# Patient Record
Sex: Female | Born: 2000
Health system: Southern US, Community
[De-identification: ages and names within clinical notes are randomized; demographics above are authoritative.]

## PROBLEM LIST (undated history)

## (undated) DIAGNOSIS — T7840XA Allergy, unspecified, initial encounter: Secondary | ICD-10-CM

## (undated) HISTORY — PX: NO PAST SURGERIES: SHX2092

## (undated) HISTORY — DX: Allergy, unspecified, initial encounter: T78.40XA

---
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
## 2020-01-16 IMAGING — CT CT HEAR MORPH WITH CTA COR WITH SCORE WITH CA WITH CONTRAST AND
2 of 10 series · 8 of 20 positions shown, 10 images · IV contrast (APPLIED)
Comparison: None.

Addendum:
CLINICAL DATA: Chest pain

EXAM:
Cardiac CTA
MEDICATIONS:
Sub lingual nitro. 4mg and lopressor 0mg
TECHNIQUE: The patient was scanned on a Siemens Force [REDACTED]ice scanner. Gantry
rotation speed was 250 msecs. Collimation was .6 mm. A 100 kV
retrospective scan was triggered in the ascending thoracic aorta at
140 HU's Average HR during the scan was 68 bpm. The 3D data set was
interpreted on a dedicated work station using MPR, MIP and VRT
modes. A total of 80 cc of contrast was used.

[Series 11: mulitphase · axial · 0.39mm/px · z∈[+579,+637]mm · 2 of 2628 slices shown]
[im 876/2628  vessel]
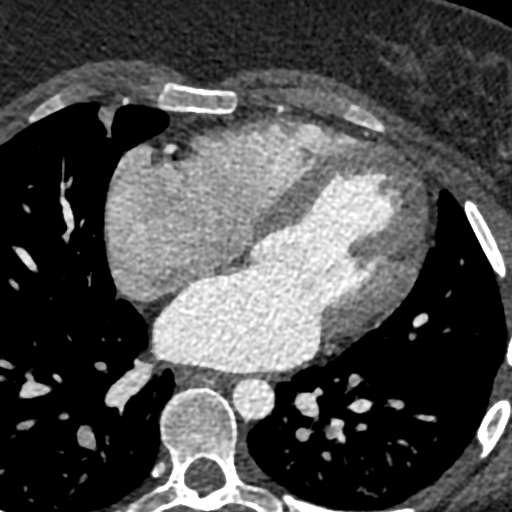
[im 1752/2628  vessel]
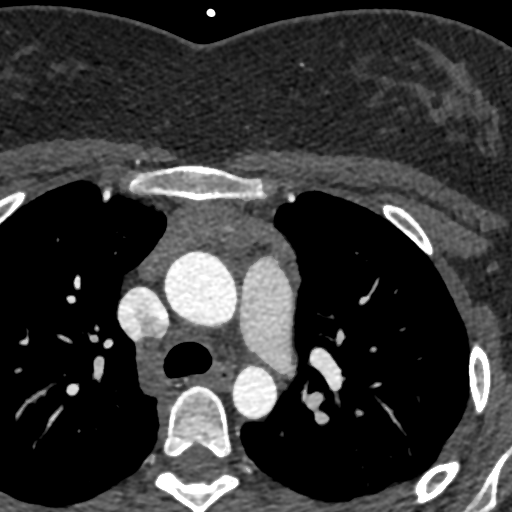

[Series 14: 0-90 · axial · 0.39mm/px · z∈[+545,+670]mm · 6 of 4380 slices shown, 8 images]
[im 626/4380  vessel]
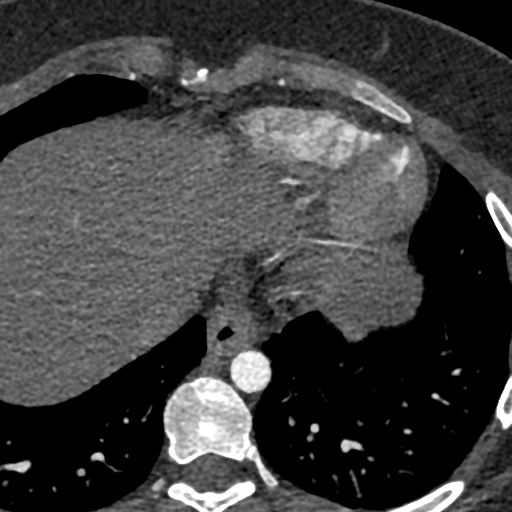
[im 626/4380  lung]
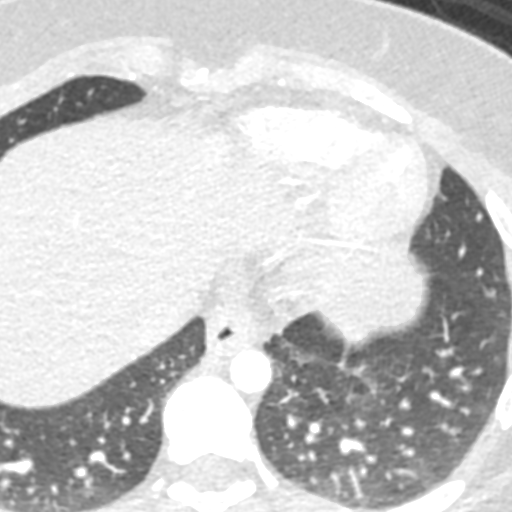
[im 1252/4380  vessel]
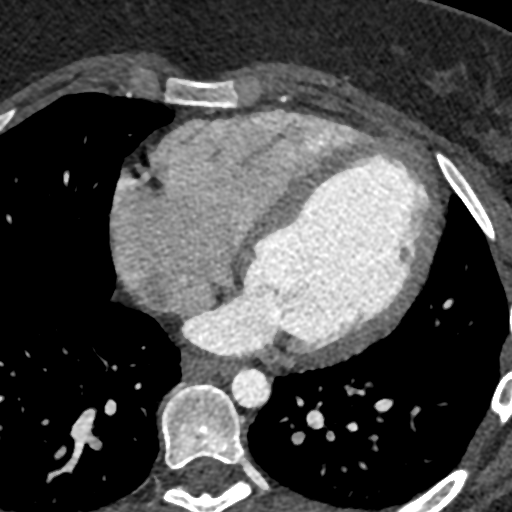
[im 1877/4380  vessel]
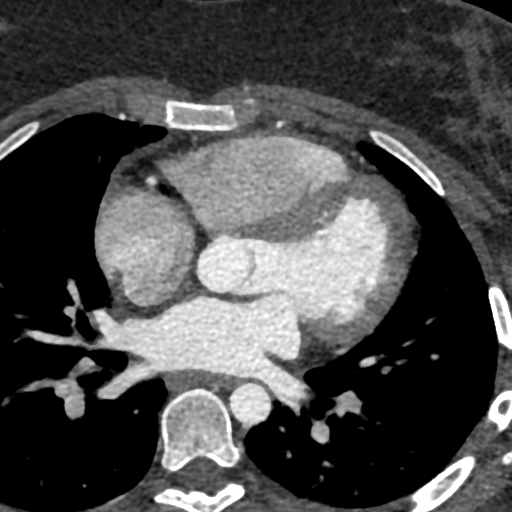
[im 2503/4380  vessel]
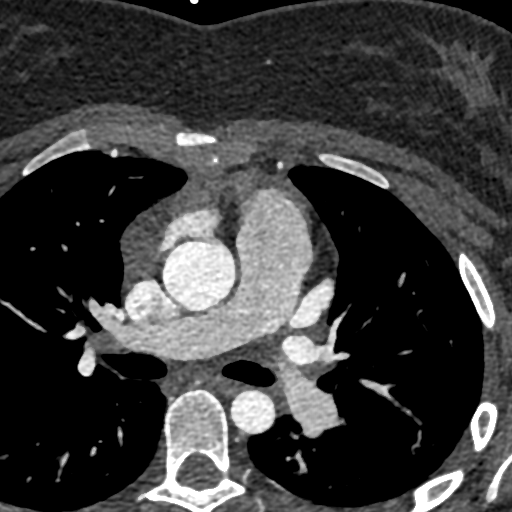
[im 3128/4380  vessel]
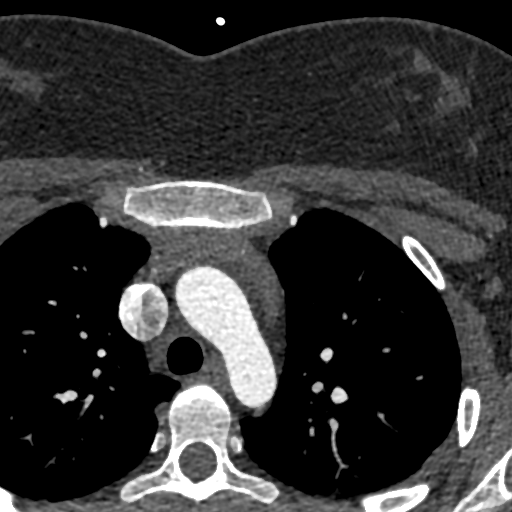
[im 3128/4380  lung]
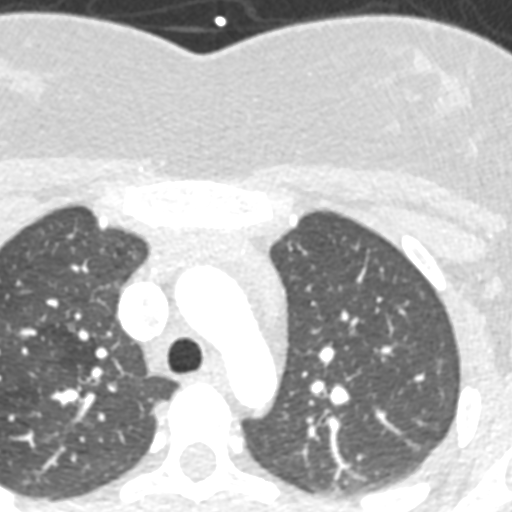
[im 3754/4380  vessel]
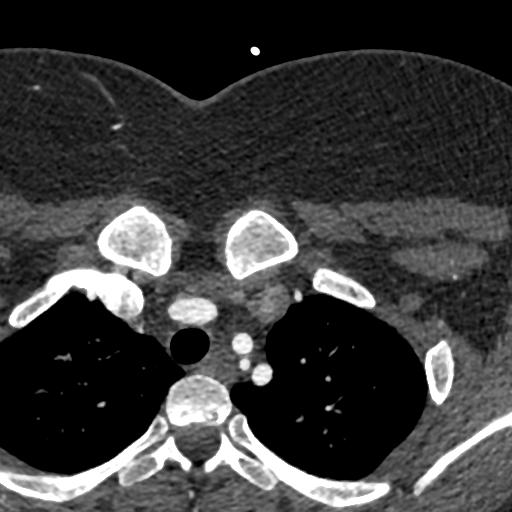

[8 of 20 positions shown; findings below may reference images not displayed]

FINDINGS: Non-cardiac: See separate report from [REDACTED]. No
significant findings on limited lung and soft tissue windows.

Calcium Score: No calcium noted

Aortic Valve Non calcified Appears functionally bicuspid with fusion
of the non and right cusps No stenosis with MAXWELL 3.2 cm 2 by
planimetry

Aorta: Normal ascending aortic root 2.6 cm. Normal arch vessels. No
coarctation

Coronary Arteries: Right dominant with no anomalies and no
myocardial bridging

LM: Normal

LAD: Normal

D1: Normal

D2: Normal

Circumflex: Normal

OM1: Normal

OM2: Normal

RCA: Normal

PDA: Normal

PLA: Normal
IMPRESSION: 1. Calcium Score 0

2. Normal aortic root and arch vessels with no coarctation Ascending
root 2.6 cm

3. Functionally bi cuspid AV with fused right and non cusps no AS,
MAXWELL by planimetry 3.2 cm2

4. Normal right dominant coronary arteries with no anomalies or
bridging

Witkacy Elsner

EXAM:
OVER-READ INTERPRETATION  CT CHEST

The following report is an over-read performed by radiologist Dr.
over-read does not include interpretation of cardiac or coronary
anatomy or pathology. The interpretation by the cardiologist is
attached.
FINDINGS: There may be distal esophageal wall thickening which can be seen
with gastroesophageal reflux. Heart is enlarged. No pathologically
enlarged lymph nodes. Visualized upper abdomen is unremarkable. No
worrisome pulmonary nodules. No pleural fluid. Osseous structures
are unremarkable.
IMPRESSION: No acute findings.

*** End of Addendum ***
FINDINGS: Non-cardiac: See separate report from [REDACTED]. No
significant findings on limited lung and soft tissue windows.

Calcium Score: No calcium noted

Aortic Valve Non calcified Appears functionally bicuspid with fusion
of the non and right cusps No stenosis with MAXWELL 3.2 cm 2 by
planimetry

Aorta: Normal ascending aortic root 2.6 cm. Normal arch vessels. No
coarctation

Coronary Arteries: Right dominant with no anomalies and no
myocardial bridging

LM: Normal

LAD: Normal

D1: Normal

D2: Normal

Circumflex: Normal

OM1: Normal

OM2: Normal

RCA: Normal

PDA: Normal

PLA: Normal
IMPRESSION: 1. Calcium Score 0

2. Normal aortic root and arch vessels with no coarctation Ascending
root 2.6 cm

3. Functionally bi cuspid AV with fused right and non cusps no AS,
MAXWELL by planimetry 3.2 cm2

4. Normal right dominant coronary arteries with no anomalies or
bridging

Witkacy Elsner

## 2020-02-02 ENCOUNTER — Encounter: Payer: Self-pay | Admitting: Pediatrics

## 2020-05-07 ENCOUNTER — Other Ambulatory Visit: Payer: Self-pay

## 2020-05-07 MED ORDER — FAMOTIDINE 20 MG PO TABS: 20.0000 mg | ORAL_TABLET | Freq: Every day | ORAL | 0 refills | Status: DC

## 2020-06-21 ENCOUNTER — Encounter: Payer: Self-pay | Admitting: Internal Medicine

## 2020-06-21 ENCOUNTER — Telehealth: Payer: Self-pay | Admitting: *Deleted

## 2020-06-21 ENCOUNTER — Telehealth (INDEPENDENT_AMBULATORY_CARE_PROVIDER_SITE_OTHER): Payer: BC Managed Care – PPO | Admitting: Internal Medicine

## 2020-06-21 ENCOUNTER — Telehealth: Payer: Self-pay | Admitting: Internal Medicine

## 2020-06-21 VITALS — BP 152/101 | HR 59 | Ht 66.0 in | Wt 190.0 lb

## 2020-06-21 DIAGNOSIS — Q231 Congenital insufficiency of aortic valve: Secondary | ICD-10-CM | POA: Diagnosis not present

## 2020-06-21 DIAGNOSIS — R079 Chest pain, unspecified: Secondary | ICD-10-CM | POA: Diagnosis not present

## 2020-06-21 DIAGNOSIS — I10 Essential (primary) hypertension: Secondary | ICD-10-CM | POA: Diagnosis not present

## 2020-06-21 NOTE — Telephone Encounter (Signed)
Spoke with patient regarding appointment for renal doppler scheduled Friday 06/29/20 at 8:00 am here at [facility].  Patient is to be NPO after midnight and arrive at 7:45 am for check in.  She voiced her understanding.

## 2020-06-21 NOTE — Progress Notes (Signed)
Virtual Visit via Telephone Note   This visit type was conducted due to national recommendations for restrictions regarding the COVID-19 Pandemic (e.g. social distancing) in an effort to limit this patient's exposure and mitigate transmission in our community.  Due to her co-morbid illnesses, this patient is at least at moderate risk for complications without adequate follow up.  This format is felt to be most appropriate for this patient at this time.  The patient did not have access to video technology/had technical difficulties with video requiring transitioning to audio format only (telephone).  All issues noted in this document were discussed and addressed.  No physical exam could be performed with this format.  Please refer to the patient's chart for her  consent to telehealth for [facility].  Date:  06/21/2020   ID:  Elaine Wood, DOB 2001/02/15, MRN [id number] The patient was identified using 2 identifiers.  Patient Location: Home Provider Location: Home Office  PCP:  Patient, No Pcp Per  Cardiologist:  No primary care provider on file.  Electrophysiologist:  None   Evaluation Performed:  Follow-Up Visit  Chief Complaint:  Elevated BP  History of Present Illness:    Elaine Wood is a 19 y.o. female with bicuspid aortic valve and chest pain who presents for follow up.   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 She has lightheadedness and feels like going to pass out with quick position changes.   High blood pressure after covid shot, high pulse rate 180/113. No coarctation on CTA last year. July 8 second covid shot.   When she has anxiety she will get chest pain.   The patient does not have symptoms concerning for COVID-19 infection (fever, chills, cough, or new shortness of breath).    Past Medical History:  Diagnosis Date  . Allergy   . Bicuspid aortic valve determined by imaging 07/18/2019  . BMI (body mass index), pediatric, 85% to less  than 95% for age 57/11/2014  . Fatigue 08/28/2017  . Migraine without aura and without status migrainosus, not intractable 03/03/2018  . Overweight 10/12/2013  . Shortness of breath 06/20/2019  . Stabbing chest pain 06/20/2019  . Tension headache 03/24/2018   Past Surgical History:  Procedure Laterality Date  . NO PAST SURGERIES       Current Meds  Medication Sig  . cetirizine (ZYRTEC) 10 MG tablet Take by mouth.  . famotidine (PEPCID) 20 MG tablet Take 1 tablet (20 mg total) by mouth daily. Please schedule an over due appointment for further refills.     Allergies:   Amoxicillin   Social History   Tobacco Use  . Smoking status: Never Smoker  . Smokeless tobacco: Never Used  Vaping Use  . Vaping Use: Never used  Substance Use Topics  . Alcohol use: No  . Drug use: No     Family Hx: The patient's family history includes Cancer in her paternal grandmother; Diabetes in her maternal grandfather and mother; Migraines in her maternal aunt; Thyroid disease in her maternal aunt and mother. There is no history of Alcohol abuse, Arthritis, Asthma, Birth defects, COPD, Depression, Drug abuse, Early death, Hearing loss, Heart disease, Hyperlipidemia, Hypertension, Kidney disease, Learning disabilities, Mental illness, Mental retardation, Miscarriages / Stillbirths, Stroke, Vision loss, Varicose Veins, Seizures, Autism, ADD / ADHD, Anxiety disorder, Bipolar disorder, or Schizophrenia.  ROS:   Please see the history of present illness.     All other systems reviewed and are negative.  Prior CV studies:   The following studies were reviewed today:    Labs/Other Tests and Data Reviewed:    EKG:  No ECG reviewed.  Recent Labs: 07/22/2019: ALT 25; BUN 12; Creatinine 0.70; Hemoglobin 14.3; Platelet Count 209; Potassium 4.1; Sodium 141   Recent Lipid Panel No results found for: CHOL, TRIG, HDL, CHOLHDL, LDLCALC, LDLDIRECT  Wt Readings from Last 3 Encounters:  06/21/20 190 lb (86.2 kg)  (96 %, Z= 1.79)*  07/22/19 189 lb 12.8 oz (86.1 kg) (97 %, Z= 1.82)*  07/18/19 189 lb 6.4 oz (85.9 kg) (97 %, Z= 1.82)*   * Growth percentiles are based on CDC (Girls, 2-20 Years) data.     Objective:    Vital Signs:  BP (!) 152/101   Pulse 59   Ht 5\' 6"  (1.676 m)   Wt 190 lb (86.2 kg)   BMI 30.67 kg/m    VITAL SIGNS:  reviewed GEN:  no acute distress RESPIRATORY:  normal respiratory effort, no increased work of breathing NEURO:  alert and oriented x 3, speech normal PSYCH:  normal affect   ASSESSMENT & PLAN:    Elevated BP - will obtain renal ultrasound. Counseled on taking BP daily and when feeling unwell. Will follow up in 1 mo with BP log.   BAV - discussed first degree family screening and follow up plan/natural history.   COVID-19 Education: The signs and symptoms of COVID-19 were discussed with the patient and how to seek care for testing (follow up with PCP or arrange E-visit).  The importance of social distancing was discussed today.  Time:   Today, I have spent 15 minutes with the patient with telehealth technology discussing the above problems.     Medication Adjustments/Labs and Tests Ordered: Current medicines are reviewed at length with the patient today.  Concerns regarding medicines are outlined above.   Tests Ordered: No orders of the defined types were placed in this encounter.   Medication Changes: No orders of the defined types were placed in this encounter.   Follow Up:   1 mo virtual  Signed, , MD  06/21/2020 7:56 AM    [facility]

## 2020-06-21 NOTE — Telephone Encounter (Signed)
  Patient Consent for Virtual Visit         Elaine Wood has provided verbal consent on 06/21/2020 for a virtual visit (video or telephone).   CONSENT FOR VIRTUAL VISIT FOR:  Elaine Wood  By participating in this virtual visit I agree to the following:  I hereby voluntarily request, consent and authorize [facility] and its employed or contracted physicians, physician assistants, nurse practitioners or other licensed health care professionals (the Practitioner), to provide me with telemedicine health care services (the "Services") as deemed necessary by the treating Practitioner. I acknowledge and consent to receive the Services by the Practitioner via telemedicine. I understand that the telemedicine visit will involve communicating with the Practitioner through live audiovisual communication technology and the disclosure of certain medical information by electronic transmission. I acknowledge that I have been given the opportunity to request an in-person assessment or other available alternative prior to the telemedicine visit and am voluntarily participating in the telemedicine visit.  I understand that I have the right to withhold or withdraw my consent to the use of telemedicine in the course of my care at any time, without affecting my right to future care or treatment, and that the Practitioner or I may terminate the telemedicine visit at any time. I understand that I have the right to inspect all information obtained and/or recorded in the course of the telemedicine visit and may receive copies of available information for a reasonable fee.  I understand that some of the potential risks of receiving the Services via telemedicine include:  Marland Kitchen Delay or interruption in medical evaluation due to technological equipment failure or disruption; . Information transmitted may not be sufficient (e.g. poor resolution of images) to allow for appropriate medical decision making by the Practitioner;  and/or  . In rare instances, security protocols could fail, causing a breach of personal health information.  Furthermore, I acknowledge that it is my responsibility to provide information about my medical history, conditions and care that is complete and accurate to the best of my ability. I acknowledge that Practitioner's advice, recommendations, and/or decision may be based on factors not within their control, such as incomplete or inaccurate data provided by me or distortions of diagnostic images or specimens that may result from electronic transmissions. I understand that the practice of medicine is not an exact science and that Practitioner makes no warranties or guarantees regarding treatment outcomes. I acknowledge that a copy of this consent can be made available to me via my patient portal [facility] MyChart), or I can request a printed copy by calling the office of [facility].    I understand that my insurance will be billed for this visit.   I have read or had this consent read to me. . I understand the contents of this consent, which adequately explains the benefits and risks of the Services being provided via telemedicine.  . I have been provided ample opportunity to ask questions regarding this consent and the Services and have had my questions answered to my satisfaction. . I give my informed consent for the services to be provided through the use of telemedicine in my medical care

## 2020-06-21 NOTE — Patient Instructions (Addendum)
Medication Instructions:  No changes *If you need a refill on your cardiac medications before your next appointment, please call your pharmacy*   Lab Work: None ordered If you have labs (blood work) drawn today and your tests are completely normal, you will receive your results only by: Marland Kitchen MyChart Message (if you have MyChart) OR . A paper copy in the mail If you have any lab test that is abnormal or we need to change your treatment, we will call you to review the results.   Testing/Procedures: Your physician has requested that you have a renal artery duplex. During this test, an ultrasound is used to evaluate blood flow to the kidneys. Take your medications as you usually do. This will take place at [address].   No food after 11PM the night before.  Water is OK. (Don't drink liquids if you have been instructed not to for ANOTHER test).  Take two Extra-Strength Gas-X capsules at bedtime the night before test.   Take an additional two Extra-Strength Gas-X capsules three (3) hours before the test or first thing in the morning.    Avoid foods that produce bowel gas, for 24 hours prior to exam (see below).    No breakfast, no chewing gum, no smoking or carbonated beverages.  Patient may take morning medications with water.  Come in for test at least 15 minutes early to register.    Follow-Up: At [facility], you and your health needs are our priority.  As part of our continuing mission to provide you with exceptional heart care, we have created designated Provider Care Teams.  These Care Teams include your primary Cardiologist (physician) and Advanced Practice Providers (APPs -  Physician Assistants and Nurse Practitioners) who all work together to provide you with the care you need, when you need it.  We recommend signing up for the patient portal called "MyChart".  Sign up information is provided on this After Visit Summary.  MyChart is used to connect with  patients for Virtual Visits (Telemedicine).  Patients are able to view lab/test results, encounter notes, upcoming appointments, etc.  Non-urgent messages can be sent to your provider as well.   To learn more about what you can do with MyChart, go to [email or web address].    Your next appointment:   Follow up with Dr. Jacques Navy on 07/13/20 at 8:40 am. This will be a virtual appointment   Please check your blood pressure daily and when you are not feeling well. Keep a log of these pressures and heart rates.

## 2020-06-21 NOTE — Telephone Encounter (Signed)
Left message for patient to call and schedule Renal doppler ordered by Dr. Jacques Navy

## 2020-06-22 DIAGNOSIS — Z01419 Encounter for gynecological examination (general) (routine) without abnormal findings: Secondary | ICD-10-CM | POA: Diagnosis not present

## 2020-06-22 DIAGNOSIS — Z6831 Body mass index (BMI) 31.0-31.9, adult: Secondary | ICD-10-CM | POA: Diagnosis not present

## 2020-06-29 ENCOUNTER — Other Ambulatory Visit: Payer: Self-pay

## 2020-06-29 ENCOUNTER — Ambulatory Visit (HOSPITAL_COMMUNITY)
Admission: RE | Admit: 2020-06-29 | Discharge: 2020-06-29 | Disposition: A | Discharge status: Home / Self Care | Payer: BC Managed Care – PPO | Source: Ambulatory Visit | Attending: Cardiology | Admitting: Cardiology

## 2020-06-29 DIAGNOSIS — I10 Essential (primary) hypertension: Secondary | ICD-10-CM | POA: Diagnosis not present

## 2020-07-02 ENCOUNTER — Encounter: Payer: Self-pay | Admitting: Internal Medicine

## 2020-07-13 ENCOUNTER — Telehealth: Payer: BC Managed Care – PPO | Admitting: Internal Medicine

## 2020-07-19 ENCOUNTER — Telehealth (INDEPENDENT_AMBULATORY_CARE_PROVIDER_SITE_OTHER): Payer: BC Managed Care – PPO | Admitting: Internal Medicine

## 2020-07-19 VITALS — BP 158/93 | HR 68 | Ht 66.0 in | Wt 190.0 lb

## 2020-07-19 DIAGNOSIS — Q231 Congenital insufficiency of aortic valve: Secondary | ICD-10-CM | POA: Diagnosis not present

## 2020-07-19 DIAGNOSIS — R42 Dizziness and giddiness: Secondary | ICD-10-CM | POA: Diagnosis not present

## 2020-07-19 DIAGNOSIS — R079 Chest pain, unspecified: Secondary | ICD-10-CM | POA: Diagnosis not present

## 2020-07-19 NOTE — Patient Instructions (Signed)
Medication Instructions:  Your Physician recommend you continue on your current medication as directed.    *If you need a refill on your cardiac medications before your next appointment, please call your pharmacy*   Lab Work: Your physician recommends that you return for lab work ( CBC, BMP, TSH, Iron, Hgb)  If you have labs (blood work) drawn today and your tests are completely normal, you will receive your results only by:  MyChart Message (if you have MyChart) OR  A paper copy in the mail If you have any lab test that is abnormal or we need to change your treatment, we will call you to review the results.   Testing/Procedures: None   Follow-Up: At [facility], you and your health needs are our priority.  As part of our continuing mission to provide you with exceptional heart care, we have created designated Provider Care Teams.  These Care Teams include your primary Cardiologist (physician) and Advanced Practice Providers (APPs -  Physician Assistants and Nurse Practitioners) who all work together to provide you with the care you need, when you need it.  We recommend signing up for the patient portal called "MyChart".  Sign up information is provided on this After Visit Summary.  MyChart is used to connect with patients for Virtual Visits (Telemedicine).  Patients are able to view lab/test results, encounter notes, upcoming appointments, etc.  Non-urgent messages can be sent to your provider as well.   To learn more about what you can do with MyChart, go to [email or web address].    Your next appointment:   4-5 month(s)  The format for your next appointment:   In Person  Provider:   Weston Brass, MD or APP

## 2020-07-19 NOTE — Progress Notes (Signed)
Virtual Visit via Telephone Note   This visit type was conducted due to national recommendations for restrictions regarding the COVID-19 Pandemic (e.g. social distancing) in an effort to limit this patient's exposure and mitigate transmission in our community.  Due to her co-morbid illnesses, this patient is at least at moderate risk for complications without adequate follow up.  This format is felt to be most appropriate for this patient at this time.  The patient did not have access to video technology/had technical difficulties with video requiring transitioning to audio format only (telephone).  All issues noted in this document were discussed and addressed.  No physical exam could be performed with this format.  Please refer to the patient's chart for her  consent to telehealth for [facility].    Date:  07/19/2020   ID:  Elaine Wood, DOB 03-27-2001, MRN [id number] The patient was identified using 2 identifiers.  Patient Location: Home Provider Location: Home Office  PCP:  Patient, No Pcp Per  Cardiologist:  No primary care provider on file.  Electrophysiologist:  None   Evaluation Performed:  Follow-Up Visit  Chief Complaint:  F/u BP  History of Present Illness:    Elaine Wood is a 19 y.o. female with bicuspid aortic valve and chest pain.   Notes BP generally in 120-130 mmHg systolic.  100-113 mmHg systolic - 2x a week  150/104 mmHg systolic- 1 x a week.   Can hear her heart beating and whooshing in her head. Dizziness with higher blood pressure.  Feels weak with bp low. She will feel like she is going to pass out. Has checked her blood glucose at the time and it was 80.   We reviewed renal ultrasound. Findings suggest at least 1-59% stenosis.   Chest pain  - due to anxiety, chest pain on right. Will occasionally have chest pain on left that is not related to anxiety.  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  The patient does not have symptoms concerning for  COVID-19 infection (fever, chills, cough, or new shortness of breath).    Past Medical History:  Diagnosis Date  . Allergy   . Bicuspid aortic valve determined by imaging 07/18/2019  . BMI (body mass index), pediatric, 85% to less than 95% for age 39/11/2014  . Fatigue 08/28/2017  . Migraine without aura and without status migrainosus, not intractable 03/03/2018  . Overweight 10/12/2013  . Shortness of breath 06/20/2019  . Stabbing chest pain 06/20/2019  . Tension headache 03/24/2018   Past Surgical History:  Procedure Laterality Date  . NO PAST SURGERIES       Current Meds  Medication Sig  . cetirizine (ZYRTEC) 10 MG tablet Take by mouth.  . famotidine (PEPCID) 20 MG tablet Take 20 mg by mouth daily as needed for heartburn or indigestion.  . norethindrone (NORLYDA) 0.35 MG tablet Take 1 tablet by mouth daily.  . [DISCONTINUED] famotidine (PEPCID) 20 MG tablet Take 1 tablet (20 mg total) by mouth daily. Please schedule an over due appointment for further refills. (Patient taking differently: Take 20 mg by mouth daily as needed. Please schedule an over due appointment for further refills.)     Allergies:   Amoxicillin   Social History   Tobacco Use  . Smoking status: Never Smoker  . Smokeless tobacco: Never Used  Vaping Use  . Vaping Use: Never used  Substance Use Topics  . Alcohol use: No  . Drug use: No  Family Hx: The patient's family history includes Cancer in her paternal grandmother; Diabetes in her maternal grandfather and mother; Migraines in her maternal aunt; Thyroid disease in her maternal aunt and mother. There is no history of Alcohol abuse, Arthritis, Asthma, Birth defects, COPD, Depression, Drug abuse, Early death, Hearing loss, Heart disease, Hyperlipidemia, Hypertension, Kidney disease, Learning disabilities, Mental illness, Mental retardation, Miscarriages / Stillbirths, Stroke, Vision loss, Varicose Veins, Seizures, Autism, ADD / ADHD, Anxiety disorder, Bipolar  disorder, or Schizophrenia.  ROS:   Please see the history of present illness.     All other systems reviewed and are negative.   Prior CV studies:   The following studies were reviewed today:    Labs/Other Tests and Data Reviewed:    EKG:  No ECG reviewed.  Recent Labs: 07/22/2019: ALT 25; BUN 12; Creatinine 0.70; Hemoglobin 14.3; Platelet Count 209; Potassium 4.1; Sodium 141   Recent Lipid Panel No results found for: CHOL, TRIG, HDL, CHOLHDL, LDLCALC, LDLDIRECT  Wt Readings from Last 3 Encounters:  07/19/20 190 lb (86.2 kg) (96 %, Z= 1.79)*  06/21/20 190 lb (86.2 kg) (96 %, Z= 1.79)*  07/22/19 189 lb 12.8 oz (86.1 kg) (97 %, Z= 1.82)*   * Growth percentiles are based on CDC (Girls, 2-20 Years) data.     Objective:    Vital Signs:  BP (!) 158/93   Pulse 68   Ht 5\' 6"  (1.676 m)   Wt 190 lb (86.2 kg)   BMI 30.67 kg/m    VITAL SIGNS:  reviewed GEN:  no acute distress RESPIRATORY:  normal respiratory effort, no increased work of breathing NEURO:  alert and oriented x 3, speech normal PSYCH:  normal affect   ASSESSMENT & PLAN:    1. Chest pain, unspecified type   2. Dizziness   3. Bicuspid aortic valve determined by imaging    Labile BP - We will observe at this time due to occasional symptomatic low BP. Will review renal ultrasound findings with Vascular specialist. Consider head and neck cross sectional angiography for possible pulsatile tinnitus.   Dizziness - will obtain repeat labs in December to evaluate thyroid function, screen for diabetes and evaluate renal function and electrolytes. Will repeat iron studies - ferritin low but not on iron supplementation.   COVID-19 Education: The signs and symptoms of COVID-19 were discussed with the patient and how to seek care for testing (follow up with PCP or arrange E-visit).  The importance of social distancing was discussed today.  Time:   Today, I have spent 15 minutes with the patient with telehealth  technology discussing the above problems.     Medication Adjustments/Labs and Tests Ordered: Current medicines are reviewed at length with the patient today.  Concerns regarding medicines are outlined above.   Tests Ordered: No orders of the defined types were placed in this encounter.   Medication Changes: No orders of the defined types were placed in this encounter.   Follow Up:  December 2021  Signed, January 2022, MD  07/19/2020 8:01 AM    [facility]

## 2020-11-21 ENCOUNTER — Other Ambulatory Visit: Payer: Self-pay

## 2020-11-21 ENCOUNTER — Ambulatory Visit (INDEPENDENT_AMBULATORY_CARE_PROVIDER_SITE_OTHER): Payer: BC Managed Care – PPO | Admitting: Cardiology

## 2020-11-21 ENCOUNTER — Encounter: Payer: Self-pay | Admitting: Cardiology

## 2020-11-21 VITALS — BP 138/72 | HR 81 | Ht 66.0 in | Wt 183.2 lb

## 2020-11-21 DIAGNOSIS — D518 Other vitamin B12 deficiency anemias: Secondary | ICD-10-CM | POA: Diagnosis not present

## 2020-11-21 DIAGNOSIS — R5383 Other fatigue: Secondary | ICD-10-CM | POA: Diagnosis not present

## 2020-11-21 DIAGNOSIS — Z79899 Other long term (current) drug therapy: Secondary | ICD-10-CM | POA: Diagnosis not present

## 2020-11-21 DIAGNOSIS — I701 Atherosclerosis of renal artery: Secondary | ICD-10-CM

## 2020-11-21 DIAGNOSIS — Q231 Congenital insufficiency of aortic valve: Secondary | ICD-10-CM | POA: Diagnosis not present

## 2020-11-21 NOTE — Progress Notes (Addendum)
Addendum:  Reviewed with dr Earna Coder- will suggest a renal MRA to further evaluate her renal artery stenosis.  Elaine Shelter PA-C 11/22/2020 8:55 AM    Cardiology Office Note:    Date:  11/21/2020   ID:  Elaine Wood, DOB 22-Nov-2001, MRN [id number]  PCP:  Patient, No Pcp Per  Cardiologist:  Dr Jacques Navy Electrophysiologist:  None   Referring MD: No ref. provider found   No chief complaint on file.   History of Present Illness:    Elaine Wood is a pleasant 19 y.o. female who was seen by Dr. Jacques Navy in August 2020 for evaluation of chest pain.  The patient had an extensive work-up including CT angiogram, echocardiogram and a 7-day monitor.  Echocardiogram revealed normal LV function and possible bicuspid aortic valve.  CT angiogram of the chest revealed a functional bicuspid aortic valve with no AS.  Her aortic root was normal, there was no coarctation noted.  Coronary arteries were normal with right dominant anatomy.  Calcium score was 0.  She had been seen in the office 6 months ago with complaints of labile blood pressure readings and intermittent dizziness associated with both low blood pressure.  Renal artery Dopplers were obtained which showed a 1 to 59% narrowing in her renal arteries.  This was to be reviewed with one of our vascular specialist.  The patient is in the office today to be seen for a 82-month follow-up.  Since we saw her last she has done well.  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  She is not had syncope or near syncope.  Past Medical History:  Diagnosis Date  . Allergy   . Bicuspid aortic valve determined by imaging 07/18/2019  . BMI (body mass index), pediatric, 85% to less than 95% for age 17/11/2014  . Fatigue 08/28/2017  . Migraine without aura and without status migrainosus, not intractable 03/03/2018  . Overweight 10/12/2013  . Shortness of breath 06/20/2019  . Stabbing chest pain 06/20/2019  . Tension headache 03/24/2018    Past Surgical History:   Procedure Laterality Date  . NO PAST SURGERIES      Current Medications: Current Meds  Medication Sig  . famotidine (PEPCID) 20 MG tablet Take 20 mg by mouth daily as needed for heartburn or indigestion.  Marland Kitchen levocetirizine (XYZAL) 5 MG tablet Take 1 tablet by mouth daily.  . norethindrone (MICRONOR) 0.35 MG tablet Take 1 tablet by mouth daily.     Allergies:   Amoxicillin   Social History   Socioeconomic History  . Marital status: Single    Spouse name: Not on file  . Number of children: Not on file  . Years of education: Not on file  . Highest education level: Not on file  Occupational History  . Not on file  Tobacco Use  . Smoking status: Never Smoker  . Smokeless tobacco: Never Used  Vaping Use  . Vaping Use: Never used  Substance and Sexual Activity  . Alcohol use: No  . Drug use: No  . Sexual activity: Never    Birth control/protection: Abstinence  Other Topics Concern  . Not on file  Social History Narrative   Patient lives with mom and dad. 12th grade at [employer]. She does well in school. She likes dancing, hanging out with her friends and managing lacrosse   Social Determinants of Health   Financial Resource Strain: Not on file  Food Insecurity: Not on file  Transportation Needs: Not on file  Physical Activity: Not on file  Stress: Not on file  Social Connections: Not on file     Family History: The patient's family history includes Cancer in her paternal grandmother; Diabetes in her maternal grandfather and mother; Migraines in her maternal aunt; Thyroid disease in her maternal aunt and mother. There is no history of Alcohol abuse, Arthritis, Asthma, Birth defects, COPD, Depression, Drug abuse, Early death, Hearing loss, Heart disease, Hyperlipidemia, Hypertension, Kidney disease, Learning disabilities, Mental illness, Mental retardation, Miscarriages / Stillbirths, Stroke, Vision loss, Varicose Veins, Seizures, Autism, ADD / ADHD,  Anxiety disorder, Bipolar disorder, or Schizophrenia.  ROS:   Please see the history of present illness.     All other systems reviewed and are negative.  EKGs/Labs/Other Studies Reviewed:    The following studies were reviewed today: Renal US 06/29/2020- Right: Normal size right kidney. Normal right Resistive Index.     Normal cortical thickness of right kidney. 1-59% stenosis of     the right renal artery. The renal artery appears to be at     least 1-59% stenosis, but may be greater due to elevated     proximal aortic velocities skewing RAR.  Left: Normal size of left kidney. Normal left Resistive Index.     Normal cortical thickness of the left kidney. 1-59% stenosis     of the left renal artery. The renal artery appears to be at     least 1-59% stenosis, but may be greater due to elevated     proximal aortic velocities skewing RAR.  Mesenteric:  Normal Celiac artery and Superior Mesenteric artery findings.    No obvious plaque is noted at the proximal aorta segment; however,  elevated  velocities are evident suggesting >50% stenosis.    Patent IVC.   EKG:  EKG is ordered today.  The ekg ordered today demonstrates NSR, HR 81  Recent Labs: No results found for requested labs within last 8760 hours.  Recent Lipid Panel No results found for: CHOL, TRIG, HDL, CHOLHDL, VLDL, LDLCALC, LDLDIRECT  Physical Exam:    VS:  BP 138/72   Pulse 81   Ht 5\' 6"  (1.676 m)   Wt 183 lb 3.2 oz (83.1 kg)   BMI 29.57 kg/m     Wt Readings from Last 3 Encounters:  11/21/20 183 lb 3.2 oz (83.1 kg) (95 %, Z= 1.66)*  07/19/20 190 lb (86.2 kg) (96 %, Z= 1.79)*  06/21/20 190 lb (86.2 kg) (96 %, Z= 1.79)*   * Growth percentiles are based on CDC (Girls, 2-20 Years) data.     GEN:  Well nourished, well developed in no acute distress HEENT: Normal NECK: No JVD; No carotid bruits CARDIAC: RRR, no murmurs, rubs, gallops RESPIRATORY:  Clear to auscultation  without rales, wheezing or rhonchi  ABDOMEN: Soft,  non-distended MUSCULOSKELETAL:  No edema; No deformity  SKIN: Warm and dry NEUROLOGIC:  Alert and oriented x 3 PSYCHIATRIC:  Normal affect   ASSESSMENT:    Bicuspid aortic valve determined by imaging Asymptomatic  Fatigue Dr 06/23/20 wanted labs today which I have ordered-anemia panel, BMP, TSH  Renal artery stenosis [facility]) Dr [facility] mentioned reviewing this with a vascular specialist- I will as her to comment.  PLAN:    Labs as ordered.  Dr Jacques Navy in 6 months.    Medication Adjustments/Labs and Tests Ordered: Current medicines are reviewed at length with the patient today.  Concerns regarding medicines are outlined above.  Orders Placed This Encounter  Procedures  . TSH  .  HgB A1c  . Basic Metabolic Panel (BMET)  . CBC  . Vitamin B12  . Folate  . Iron and TIBC  . Ferritin   No orders of the defined types were placed in this encounter.   Patient Instructions  Medication Instructions:  Continue current medications  *If you need a refill on your cardiac medications before your next appointment, please call your pharmacy*   Lab Work: TSH, HgB A1C, BMP, CBC and Anemia Panel  If you have labs (blood work) drawn today and your tests are completely normal, you will receive your results only by: Marland Kitchen MyChart Message (if you have MyChart) OR . A paper copy in the mail If you have any lab test that is abnormal or we need to change your treatment, we will call you to review the results.   Testing/Procedures: None Ordered   Follow-Up: At [facility], you and your health needs are our priority.  As part of our continuing mission to provide you with exceptional heart care, we have created designated Provider Care Teams.  These Care Teams include your primary Cardiologist (physician) and Advanced Practice Providers (APPs -  Physician Assistants and Nurse Practitioners) who all work together to provide you with the care  you need, when you need it.  We recommend signing up for the patient portal called "MyChart".  Sign up information is provided on this After Visit Summary.  MyChart is used to connect with patients for Virtual Visits (Telemedicine).  Patients are able to view lab/test results, encounter notes, upcoming appointments, etc.  Non-urgent messages can be sent to your provider as well.   To learn more about what you can do with MyChart, go to [email or web address].    Your next appointment:   6 month(s)  The format for your next appointment:   In Person  Provider:   You may see Dr Jacques Navy or one of the following Advanced Practice Providers on your designated Care Team:    Theodore Demark, PA-C  Joni Reining, DNP, ANP        Signed, Elaine Shelter, PA-C  11/21/2020 10:58 AM    [facility]

## 2020-11-21 NOTE — Assessment & Plan Note (Signed)
Dr Earna Coder wanted labs today which I have ordered-anemia panel, BMP, TSH

## 2020-11-21 NOTE — Assessment & Plan Note (Signed)
Dr Earna Coder mentioned reviewing this with a vascular specialist- I will as her to comment.

## 2020-11-21 NOTE — Patient Instructions (Signed)
Medication Instructions:  Continue current medications  *If you need a refill on your cardiac medications before your next appointment, please call your pharmacy*   Lab Work: TSH, HgB A1C, BMP, CBC and Anemia Panel  If you have labs (blood work) drawn today and your tests are completely normal, you will receive your results only by: Marland Kitchen MyChart Message (if you have MyChart) OR . A paper copy in the mail If you have any lab test that is abnormal or we need to change your treatment, we will call you to review the results.   Testing/Procedures: None Ordered   Follow-Up: At [facility], you and your health needs are our priority.  As part of our continuing mission to provide you with exceptional heart care, we have created designated Provider Care Teams.  These Care Teams include your primary Cardiologist (physician) and Advanced Practice Providers (APPs -  Physician Assistants and Nurse Practitioners) who all work together to provide you with the care you need, when you need it.  We recommend signing up for the patient portal called "MyChart".  Sign up information is provided on this After Visit Summary.  MyChart is used to connect with patients for Virtual Visits (Telemedicine).  Patients are able to view lab/test results, encounter notes, upcoming appointments, etc.  Non-urgent messages can be sent to your provider as well.   To learn more about what you can do with MyChart, go to [email or web address].    Your next appointment:   6 month(s)  The format for your next appointment:   In Person  Provider:   You may see Dr Jacques Navy or one of the following Advanced Practice Providers on your designated Care Team:    Theodore Demark, PA-C  Joni Reining, DNP, ANP

## 2020-11-21 NOTE — Assessment & Plan Note (Signed)
Asymptomatic. 

## 2020-11-22 ENCOUNTER — Telehealth: Payer: Self-pay | Admitting: Cardiology

## 2020-11-22 ENCOUNTER — Ambulatory Visit: Payer: BC Managed Care – PPO | Admitting: Adult Health

## 2020-11-22 LAB — TSH: TSH: 0.901 u[IU]/mL (ref 0.450–4.500)

## 2020-11-22 LAB — IRON AND TIBC
Iron Saturation: 22 % (ref 15–55)
Iron: 87 ug/dL (ref 27–159)
Total Iron Binding Capacity: 393 ug/dL (ref 250–450)
UIBC: 306 ug/dL (ref 131–425)

## 2020-11-22 LAB — BASIC METABOLIC PANEL
BUN/Creatinine Ratio: 18 (ref 9–23)
BUN: 10 mg/dL (ref 6–20)
CO2: 21 mmol/L (ref 20–29)
Calcium: 9.3 mg/dL (ref 8.7–10.2)
Chloride: 106 mmol/L (ref 96–106)
Creatinine, Ser: 0.57 mg/dL (ref 0.57–1.00)
GFR calc Af Amer: 155 mL/min/{1.73_m2} (ref >59)
GFR calc non Af Amer: 135 mL/min/{1.73_m2} (ref >59)
Glucose: 96 mg/dL (ref 65–99)
Potassium: 4.6 mmol/L (ref 3.5–5.2)
Sodium: 140 mmol/L (ref 134–144)

## 2020-11-22 LAB — CBC
Hematocrit: 43.1 % (ref 34.0–46.6)
Hemoglobin: 15 g/dL (ref 11.1–15.9)
MCH: 30.2 pg (ref 26.6–33.0)
MCHC: 34.8 g/dL (ref 31.5–35.7)
MCV: 87 fL (ref 79–97)
Platelets: 222 10*3/uL (ref 150–450)
RBC: 4.96 x10E6/uL (ref 3.77–5.28)
RDW: 12.6 % (ref 11.7–15.4)
WBC: 5.1 10*3/uL (ref 3.4–10.8)

## 2020-11-22 LAB — FOLATE: Folate: 5.7 ng/mL (ref >3.0)

## 2020-11-22 LAB — FERRITIN: Ferritin: 48 ng/mL (ref 15–77)

## 2020-11-22 LAB — HEMOGLOBIN A1C
Est. average glucose Bld gHb Est-mCnc: 97 mg/dL
Hgb A1c MFr Bld: 5 % (ref 4.8–5.6)

## 2020-11-22 LAB — VITAMIN B12: Vitamin B-12: 413 pg/mL (ref 232–1245)

## 2020-11-22 NOTE — Telephone Encounter (Signed)
I left a message for the patient to call back so I could discuss Dr.Acharya's recommendations for follow-up of her renal artery Dopplers.  Corine Shelter PA-C 11/22/2020 9:10 AM

## 2020-12-04 ENCOUNTER — Telehealth: Payer: Self-pay

## 2020-12-04 NOTE — Telephone Encounter (Signed)
Attempted to call patient in regards to recommendations from Dr. Jacques Navy regarding patients renal ultrasound.   Per Dr. Jacques Navy- order Abdominal MRA to better evaluate renal arteries.   Left message on Name Verified voicemail to return call to the office when able.

## 2020-12-05 NOTE — Addendum Note (Signed)
Addended by: Barrie Dunker on: 12/05/2020 04:20 PM   Modules accepted: Orders

## 2020-12-19 NOTE — Telephone Encounter (Signed)
Attempted to call patient, left message for patient to call back to office.   

## 2021-01-01 NOTE — Telephone Encounter (Signed)
Spoke with patient regarding MRA abdomen to evaluate renal artery. 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 Patient states she is not currently having any symptoms and states she feels well.  Scheduled patient an appointment with Dr. Jacques Navy 5/20 at 8:20am for 6  Month follow up. Advised patient to call back to office for any issues, questions, or concerns. Patient verbalized understanding.

## 2021-04-12 ENCOUNTER — Ambulatory Visit: Payer: BC Managed Care – PPO | Admitting: Internal Medicine

## 2021-04-12 ENCOUNTER — Other Ambulatory Visit: Payer: Self-pay

## 2021-04-12 ENCOUNTER — Encounter: Payer: Self-pay | Admitting: Internal Medicine

## 2021-04-12 VITALS — BP 138/82 | HR 71 | Ht 66.0 in | Wt 187.0 lb

## 2021-04-12 DIAGNOSIS — Q231 Congenital insufficiency of aortic valve: Secondary | ICD-10-CM | POA: Diagnosis not present

## 2021-04-12 DIAGNOSIS — I701 Atherosclerosis of renal artery: Secondary | ICD-10-CM | POA: Diagnosis not present

## 2021-04-12 NOTE — Patient Instructions (Signed)
Medication Instructions:  No Changes In Medications at this time.  *If you need a refill on your cardiac medications before your next appointment, please call your pharmacy*  Testing/Procedures: MRI examination of the abdomen--SOMEONE WILL REACH OUT TO YOU TO SCHEDULE THIS  Your physician has requested that you have an echocardiogram IN ONE YEAR. Echocardiography is a painless test that uses sound waves to create images of your heart. It provides your doctor with information about the size and shape of your heart and how well your heart's chambers and valves are working. You may receive an ultrasound enhancing agent through an IV if needed to better visualize your heart during the echo.This procedure takes approximately one hour. There are no restrictions for this procedure. This will take place at the [address], [address].   Follow-Up: At [facility], you and your health needs are our priority.  As part of our continuing mission to provide you with exceptional heart care, we have created designated Provider Care Teams.  These Care Teams include your primary Cardiologist (physician) and Advanced Practice Providers (APPs -  Physician Assistants and Nurse Practitioners) who all work together to provide you with the care you need, when you need it.  Your next appointment:   6 week(s)  The format for your next appointment:   Virtual Visit   Provider:   Weston Brass, MD

## 2021-04-12 NOTE — Progress Notes (Signed)
Cardiology Office Note   Date:  04/12/2021   ID:  JEYLI ZWICKER, DOB 19-Nov-2001, MRN [id number]  PCP:  Patient, No Pcp Per (Inactive)  Cardiologist:  Parke Poisson, MD  Electrophysiologist:  None   Evaluation Performed:  Follow-Up Visit  Chief Complaint:  F/u BP  History of Present Illness:    Elaine Wood is a 20 y.o. female with bicuspid aortic valve and chest pain.    04/12/2021: Today she asks for explanation of her renal artery [place] in 06/2020. We discussed the need for scheduling an abdominal MRA, and she is agreeable if scheduled before she returns to college 06/29/2021.   She does not regularly check her at home blood pressure. However, she can feel when it is elevated, which occurs about once a week. Especially occurs when bending over and straightening herself. In 12/2020 around midnight she had an episode of nausea and dizziness, then she checked her blood pressure and it was 99/60. She went to sleep and it stayed relatively low for the rest of the day.    She denies any chest pain, shortness of breath, palpitations, or exertional symptoms. No headaches, or syncope to report. Also has no lower extremity edema, orthopnea or PND.  When she drinks water, she sometimes notices a metallic taste. This resolves after eating peanut butter.  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 This summer she is taking online classes at [employer].  Her mother had PE in both lungs. She has half-brothers.  We discussed first-degree family screening for bicuspid aortic valve.  No known family history of bicuspid aortic valve, aortic dilation, or coarctation identified yet.  Past Medical History:  Diagnosis Date  . Allergy   . Bicuspid aortic valve determined by imaging 07/18/2019  . BMI (body mass index), pediatric, 85% to less than 95% for age 37/11/2014  . Fatigue 08/28/2017  . Migraine without aura and without status migrainosus, not intractable 03/03/2018  .  Overweight 10/12/2013  . Shortness of breath 06/20/2019  . Stabbing chest pain 06/20/2019  . Tension headache 03/24/2018   Past Surgical History:  Procedure Laterality Date  . NO PAST SURGERIES       Current Meds  Medication Sig  . famotidine (PEPCID) 20 MG tablet Take 20 mg by mouth daily as needed for heartburn or indigestion.  Marland Kitchen levocetirizine (XYZAL) 5 MG tablet Take 1 tablet by mouth daily.  . norethindrone (MICRONOR) 0.35 MG tablet Take 1 tablet by mouth daily.     Allergies:   Amoxicillin   Social History   Tobacco Use  . Smoking status: Never Smoker  . Smokeless tobacco: Never Used  Vaping Use  . Vaping Use: Never used  Substance Use Topics  . Alcohol use: No  . Drug use: No     Family Hx: The patient's family history includes Cancer in her paternal grandmother; Diabetes in her maternal grandfather and mother; Migraines in her maternal aunt; Thyroid disease in her maternal aunt and mother. There is no history of Alcohol abuse, Arthritis, Asthma, Birth defects, COPD, Depression, Drug abuse, Early death, Hearing loss, Heart disease, Hyperlipidemia, Hypertension, Kidney disease, Learning disabilities, Mental illness, Mental retardation, Miscarriages / Stillbirths, Stroke, Vision loss, Varicose Veins, Seizures, Autism, ADD / ADHD, Anxiety disorder, Bipolar disorder, or Schizophrenia.  ROS:   Please see the history of present illness.    (+) Nausea (+) Lightheadedness All other systems reviewed and are negative.   Prior CV studies:  The following studies were reviewed today:  Vas US Renal Artery Bilateral 06/29/2020: Largest Aortic Diameter: 1.5 cm    Renal:   Right: Normal size right kidney. Normal right Resistive Index.     Normal cortical thickness of right kidney. 1-59% stenosis of     the right renal artery. The renal artery appears to be at     least 1-59% stenosis, but may be greater due to elevated     proximal aortic velocities skewing RAR.   Left: Normal size of left kidney. Normal left Resistive Index.     Normal cortical thickness of the left kidney. 1-59% stenosis     of the left renal artery. The renal artery appears to be at     least 1-59% stenosis, but may be greater due to elevated     proximal aortic velocities skewing RAR.  Mesenteric:  Normal Celiac artery and Superior Mesenteric artery findings.    No obvious plaque is noted at the proximal aorta segment; however,  elevated  velocities are evident suggesting >50% stenosis.    Patent IVC.   CT Coronary 07/06/2019: There may be distal esophageal wall thickening which can be seen with gastroesophageal reflux. Heart is enlarged. No pathologically enlarged lymph nodes. Visualized upper abdomen is unremarkable. No worrisome pulmonary nodules. No pleural fluid. Osseous structures are unremarkable.  IMPRESSION: No acute findings.  Echo 07/04/2019: 1. The left ventricle has normal systolic function with an ejection  fraction of 60-65%. The cavity size was normal. Left ventricular diastolic  parameters were normal.  2. The right ventricle has normal systolic function. The cavity was  normal. There is no increase in right ventricular wall thickness. Right  ventricular systolic pressure is normal with an estimated pressure of 17.5  mmHg.  3. The aorta is normal in size and structure.  4. There is mild mitral regurgitation and trivial pulmonic, tricuspid and  aortic regurgitation.  5. The aortic valve has an indeterminate number of cusps. Aortic valve  regurgitation is trivial by color flow Doppler. Possible bicuspid aortic  valve.   Labs/Other Tests and Data Reviewed:    EKG:   04/12/2021: NSR, rate 73 bpm  Recent Labs: 11/21/2020: BUN 10; Creatinine, Ser 0.57; Hemoglobin 15.0; Platelets 222; Potassium 4.6; Sodium 140; TSH 0.901   Recent Lipid Panel No results found for: CHOL, TRIG, HDL, CHOLHDL, LDLCALC, LDLDIRECT  Wt Readings  from Last 3 Encounters:  04/12/21 187 lb (84.8 kg)  11/21/20 183 lb 3.2 oz (83.1 kg) (95 %, Z= 1.66)*  07/19/20 190 lb (86.2 kg) (96 %, Z= 1.79)*   * Growth percentiles are based on CDC (Girls, 2-20 Years) data.     Objective:    Vital Signs:  BP 138/82   Pulse 71   Ht 5\' 6"  (1.676 m)   Wt 187 lb (84.8 kg)   SpO2 98%   BMI 30.18 kg/m    .Constitutional: No acute distress Eyes: sclera non-icteric, normal conjunctiva and lids ENMT: normal dentition, moist mucous membranes Cardiovascular: regular rhythm, normal rate, no murmurs. S1 and S2 normal. Radial pulses normal bilaterally. No jugular venous distention.  Respiratory: clear to auscultation bilaterally GI : normal bowel sounds, soft and nontender. No distention.   MSK: extremities warm, well perfused. No edema.  NEURO: grossly nonfocal exam, moves all extremities. PSYCH: alert and oriented x 3, normal mood and affect.    ASSESSMENT & PLAN:    1. Bicuspid aortic valve determined by imaging   2. Query renal artery stenosis (  HCC)    Labile BP - We will observe at this time due to occasional symptomatic low BP.  Will obtain abdomen MRA to further evaluate abnormalities noted on renal ultrasound.  Dizziness -laboratory studies grossly within the normal range.  We discussed adequate hydration as a treatment plan for dizziness.  I have also requested that she check her blood pressure at least a few times a week to screen whether her blood pressure is low or high.   Medication Adjustments/Labs and Tests Ordered: Current medicines are reviewed at length with the patient today.  Concerns regarding medicines are outlined above.   Tests Ordered: Orders Placed This Encounter  Procedures  . MR ANGIO ABDOMEN W WO CONTRAST  . EKG 12-Lead  . ECHOCARDIOGRAM COMPLETE    Medication Changes: No orders of the defined types were placed in this encounter.   I,Mathew Stumpf,acting as a Neurosurgeon for Parke Poisson, MD.,have documented  all relevant documentation on the behalf of Parke Poisson, MD,as directed by  Parke Poisson, MD while in the presence of Parke Poisson, MD.  I, Parke Poisson, MD, have reviewed all documentation for this visit. The documentation on 04/12/21 for the exam, diagnosis, procedures, and orders are all accurate and complete.   Signed, Parke Poisson, MD  04/12/2021 8:59 AM    [facility]

## 2021-04-26 ENCOUNTER — Telehealth: Payer: Self-pay | Admitting: Internal Medicine

## 2021-04-26 NOTE — Telephone Encounter (Signed)
Left message for patient to call and discuss scheduling the MRA abdomen ordered by Dr. Hillery Jacks

## 2021-04-30 NOTE — Telephone Encounter (Signed)
Left message for patient to call and discuss scheduling the MRA abdomen ordered by Dr. Jacques Navy

## 2021-05-03 NOTE — Telephone Encounter (Signed)
Left message for patient to call and discuss scheduling the MRA Abdomen w w/o contrast ordered by Dr. Jacques Navy

## 2021-06-17 ENCOUNTER — Telehealth: Payer: BC Managed Care – PPO | Admitting: Internal Medicine

## 2021-06-26 ENCOUNTER — Telehealth: Payer: Self-pay | Admitting: *Deleted

## 2021-06-26 DIAGNOSIS — Z6831 Body mass index (BMI) 31.0-31.9, adult: Secondary | ICD-10-CM | POA: Diagnosis not present

## 2021-06-26 DIAGNOSIS — Z01419 Encounter for gynecological examination (general) (routine) without abnormal findings: Secondary | ICD-10-CM | POA: Diagnosis not present

## 2021-06-26 NOTE — Telephone Encounter (Signed)
Call from Vernona Rieger, RN at [facility] regarding pt last office visit 07/22/2019. Pt asking about lab results as she did not recall if she was given lab results. Called pt, unable to reach. LMOVM for pt to call office regarding lab results

## 2021-08-28 ENCOUNTER — Ambulatory Visit (HOSPITAL_COMMUNITY): Payer: BC Managed Care – PPO

## 2022-04-09 ENCOUNTER — Ambulatory Visit (HOSPITAL_COMMUNITY): Payer: BC Managed Care – PPO | Attending: Cardiology

## 2022-04-09 DIAGNOSIS — Q231 Congenital insufficiency of aortic valve: Secondary | ICD-10-CM | POA: Diagnosis not present

## 2022-04-09 LAB — ECHOCARDIOGRAM COMPLETE
Area-P 1/2: 3.72 cm2
P 1/2 time: 186 msec
S' Lateral: 3.2 cm

## 2022-08-15 DIAGNOSIS — Z01419 Encounter for gynecological examination (general) (routine) without abnormal findings: Secondary | ICD-10-CM | POA: Diagnosis not present

## 2022-08-15 DIAGNOSIS — Z6832 Body mass index (BMI) 32.0-32.9, adult: Secondary | ICD-10-CM | POA: Diagnosis not present

## 2022-10-10 ENCOUNTER — Ambulatory Visit: Payer: BC Managed Care – PPO | Admitting: Family

## 2022-10-10 ENCOUNTER — Encounter: Payer: Self-pay | Admitting: Family

## 2022-10-10 VITALS — BP 130/85 | HR 83 | Temp 98.4°F | Resp 16 | Ht 66.0 in | Wt 199.0 lb

## 2022-10-10 DIAGNOSIS — I701 Atherosclerosis of renal artery: Secondary | ICD-10-CM | POA: Diagnosis not present

## 2022-10-10 DIAGNOSIS — M79629 Pain in unspecified upper arm: Secondary | ICD-10-CM

## 2022-10-10 DIAGNOSIS — G43009 Migraine without aura, not intractable, without status migrainosus: Secondary | ICD-10-CM

## 2022-10-10 NOTE — Progress Notes (Signed)
Subjective:   By signing my name below, I, Elaine Wood, attest that this documentation has been prepared under the direction and in the presence of Debbrah Alar, NP. 10/10/2022      Patient ID: Elaine Wood, female    DOB: 04-29-2001, 21 y.o.   MRN: [id number]  Chief Complaint  Patient presents with   New Patient (Initial Visit)    Here to establish care, no previous adult care.     HPI Patient is in today for a new patient visit.   Swelling lymph: She reports having on and off swelling and sore lymph notes in both axillary areas. Her right [place] worse than her left. She did not feel a cyst. Her pain radiates down to her abdomen. She has mild lateral breat tenderness. Her pain does not worsen around her menstrual cycle.   Migraines: She has a history of migraines. She takes ibuprofen when her migraines worsens, otherwise she lets her migraine symptoms pass.   Social history: She is currently a Equities trader in college.   Exercise: She has not exercised recently due to pain from her axillary areas.   Allergies: She is allergic to cats.   Immunizations: She did not receive a flu vaccine this year and is not interested in receiving one. She is otherwise UTD on her immunizations at this time.      Health Maintenance Due  Topic Date Due   COVID-19 Vaccine (1) Never done   HIV Screening  Never done   Hepatitis C Screening  Never done   PAP-Cervical Cytology Screening  Never done   PAP SMEAR-Modifier  Never done    Past Medical History:  Diagnosis Date   Allergy    Bicuspid aortic valve determined by imaging 07/18/2019   BMI (body mass index), pediatric, 85% to less than 95% for age 12/25/2014   Fatigue 08/28/2017   Migraine without aura and without status migrainosus, not intractable 03/03/2018   Overweight 10/12/2013   Shortness of breath 06/20/2019   Stabbing chest pain 06/20/2019   Tension headache 03/24/2018    Past Surgical History:  Procedure Laterality Date   NO PAST  SURGERIES      Family History  Problem Relation Age of Onset   Diabetes Mother        pre diabetic   Thyroid disease Mother        hashimotos - synthroid   Diabetes Maternal Grandfather    Cancer Paternal Grandmother        asbestos   Thyroid disease Maternal Aunt    Migraines Maternal Aunt    Alcohol abuse Neg Hx    Arthritis Neg Hx    Asthma Neg Hx    Birth defects Neg Hx    COPD Neg Hx    Depression Neg Hx    Drug abuse Neg Hx    Early death Neg Hx    Hearing loss Neg Hx    Heart disease Neg Hx    Hyperlipidemia Neg Hx    Hypertension Neg Hx    Kidney disease Neg Hx    Learning disabilities Neg Hx    Mental illness Neg Hx    Mental retardation Neg Hx    Miscarriages / Stillbirths Neg Hx    Stroke Neg Hx    Vision loss Neg Hx    Varicose Veins Neg Hx    Seizures Neg Hx    Autism Neg Hx    ADD / ADHD Neg Hx    Anxiety  disorder Neg Hx    Bipolar disorder Neg Hx    Schizophrenia Neg Hx     Social History   Socioeconomic History   Marital status: Single    Spouse name: Not on file   Number of children: Not on file   Years of education: Not on file   Highest education level: Not on file  Occupational History   Not on file  Tobacco Use   Smoking status: Never   Smokeless tobacco: Never  Vaping Use   Vaping Use: Never used  Substance and Sexual Activity   Alcohol use: No   Drug use: No   Sexual activity: Never    Birth control/protection: Abstinence  Other Topics Concern   Not on file  Social History Narrative   Patient lives with mom and dad. 12th grade at [employer]. She does well in school. She likes dancing, hanging out with her friends and managing lacrosse   Social Determinants of Health   Financial Resource Strain: Low Risk  (09/17/2018)   Overall Financial Resource Strain (CARDIA)    Difficulty of Paying Living Expenses: Not hard at all  Food Insecurity: No Food Insecurity (09/17/2018)   Hunger Vital Sign    Worried  About Running Out of Food in the Last Year: Never true    Ran Out of Food in the Last Year: Never true  Transportation Needs: No Transportation Needs (09/17/2018)   PRAPARE - Administrator, Civil Service (Medical): No    Lack of Transportation (Non-Medical): No  Physical Activity: Insufficiently Active (10/10/2022)   Exercise Vital Sign    Days of Exercise per Week: 1 day    Minutes of Exercise per Session: 50 min  Stress: Stress Concern Present (09/17/2018)   [employer] of Occupational Health - Occupational Stress Questionnaire    Feeling of Stress : To some extent  Social Connections: Unknown (09/17/2018)   Social Connection and Isolation Panel [NHANES]    Frequency of Communication with Friends and Family: More than three times a week    Frequency of Social Gatherings with Friends and Family: More than three times a week    Attends Religious Services: More than 4 times per year    Active Member of [employer] or Organizations: Yes    Attends Banker Meetings: More than 4 times per year    Marital Status: Not on file  Intimate Partner Violence: Not At Risk (09/17/2018)   Humiliation, Afraid, Rape, and Kick questionnaire    Fear of Current or Ex-Partner: No    Emotionally Abused: No    Physically Abused: No    Sexually Abused: No    Outpatient Medications Prior to Visit  Medication Sig Dispense Refill   levocetirizine (XYZAL) 5 MG tablet Take 1 tablet by mouth daily.     norethindrone (MICRONOR) 0.35 MG tablet Take 1 tablet by mouth daily.     famotidine (PEPCID) 20 MG tablet Take 20 mg by mouth daily as needed for heartburn or indigestion.     No facility-administered medications prior to visit.    Allergies  Allergen Reactions   Amoxicillin Itching    Review of Systems  Constitutional:        (+)sore, swelling lymph on bilateral axillary       Objective:    Physical Exam Constitutional:      General: She is not in acute distress.     Appearance: Normal appearance. She is not ill-appearing.  HENT:  Head: Normocephalic and atraumatic.     Right Ear: External ear normal.     Left Ear: External ear normal.  Eyes:     Extraocular Movements: Extraocular movements intact.     Pupils: Pupils are equal, round, and reactive to light.  Cardiovascular:     Rate and Rhythm: Normal rate and regular rhythm.     Heart sounds: Normal heart sounds. No murmur heard.    No gallop.  Pulmonary:     Effort: Pulmonary effort is normal. No respiratory distress.     Breath sounds: Normal breath sounds. No wheezing or rales.  Chest:  Breasts:    Right: Normal.     Left: Normal.  Lymphadenopathy:     Comments: No axillary lymphadenopathy  Skin:    General: Skin is warm and dry.  Neurological:     Mental Status: She is alert and oriented to person, place, and time.  Psychiatric:        Judgment: Judgment normal.     BP 130/85 (BP Location: Left Arm, Patient Position: Sitting, Cuff Size: Small)   Pulse 83   Temp 98.4 F (36.9 C) (Oral)   Resp 16   Ht 5\' 6"  (1.676 m)   Wt 199 lb (90.3 kg)   SpO2 99%   BMI 32.12 kg/m  Wt Readings from Last 3 Encounters:  10/10/22 199 lb (90.3 kg)  04/12/21 187 lb (84.8 kg)  11/21/20 183 lb 3.2 oz (83.1 kg) (95 %, Z= 1.66)*   * Growth percentiles are based on CDC (Girls, 2-20 Years) data.       Assessment & Plan:   Problem List Items Addressed This Visit   None    No orders of the defined types were placed in this encounter.   I, Elaine Wood, personally preformed the services described in this documentation.  All medical record entries made by the scribe were at my direction and in my presence.  I have reviewed the chart and discharge instructions (if applicable) and agree that the record reflects my personal performance and is accurate and complete. 10/10/2022   I,Elaine Wood,acting as a Education administrator for Nance Pear, NP.,have documented all relevant documentation on the  behalf of Nance Pear, NP,as directed by  Nance Pear, NP while in the presence of Nance Pear, NP.   Elaine Wood

## 2022-10-12 DIAGNOSIS — M79629 Pain in unspecified upper arm: Secondary | ICD-10-CM | POA: Insufficient documentation

## 2022-10-12 DIAGNOSIS — E669 Obesity, unspecified: Secondary | ICD-10-CM | POA: Insufficient documentation

## 2022-10-12 NOTE — Assessment & Plan Note (Signed)
Stable, managed with prn ibuprofen.

## 2022-10-12 NOTE — Assessment & Plan Note (Signed)
No masses, no LAD noted, normal breast exam. Reassurance provided.

## 2022-10-12 NOTE — Assessment & Plan Note (Signed)
1-59% on previous study 2021- will update study.

## 2022-11-03 ENCOUNTER — Telehealth (HOSPITAL_BASED_OUTPATIENT_CLINIC_OR_DEPARTMENT_OTHER): Payer: Self-pay

## 2023-05-25 ENCOUNTER — Ambulatory Visit (INDEPENDENT_AMBULATORY_CARE_PROVIDER_SITE_OTHER): Payer: Commercial Managed Care - PPO | Admitting: Family

## 2023-05-25 ENCOUNTER — Encounter: Payer: Self-pay | Admitting: Family

## 2023-05-25 VITALS — BP 124/72 | HR 95 | Resp 18 | Ht 66.0 in | Wt 206.6 lb

## 2023-05-25 DIAGNOSIS — G43009 Migraine without aura, not intractable, without status migrainosus: Secondary | ICD-10-CM | POA: Diagnosis not present

## 2023-05-25 DIAGNOSIS — I701 Atherosclerosis of renal artery: Secondary | ICD-10-CM

## 2023-05-25 DIAGNOSIS — Z0000 Encounter for general adult medical examination without abnormal findings: Secondary | ICD-10-CM | POA: Diagnosis not present

## 2023-05-25 DIAGNOSIS — Z23 Encounter for immunization: Secondary | ICD-10-CM | POA: Diagnosis not present

## 2023-05-25 LAB — COMPREHENSIVE METABOLIC PANEL
ALT: 34 U/L (ref 0–35)
AST: 21 U/L (ref 0–37)
Albumin: 4.4 g/dL (ref 3.5–5.2)
Alkaline Phosphatase: 55 U/L (ref 39–117)
BUN: 11 mg/dL (ref 6–23)
CO2: 22 mEq/L (ref 19–32)
Calcium: 9.2 mg/dL (ref 8.4–10.5)
Chloride: 105 mEq/L (ref 96–112)
Creatinine, Ser: 0.58 mg/dL (ref 0.40–1.20)
GFR: 128.6 mL/min (ref >60.00)
Glucose, Bld: 84 mg/dL (ref 70–99)
Potassium: 4 mEq/L (ref 3.5–5.1)
Sodium: 136 mEq/L (ref 135–145)
Total Bilirubin: 0.5 mg/dL (ref 0.2–1.2)
Total Protein: 6.8 g/dL (ref 6.0–8.3)

## 2023-05-25 NOTE — Addendum Note (Signed)
Addended by: Maximino Sarin on: 05/25/2023 07:34 AM   Modules accepted: Orders

## 2023-05-25 NOTE — Progress Notes (Signed)
Subjective:     Patient ID: Elaine Wood, female    DOB: 15-Jan-2001, 22 y.o.   MRN: [id number]  Chief Complaint  Patient presents with   Annual Exam    HPI  Discussed the use of AI scribe software for clinical note transcription with the patient, who gave verbal consent to proceed.  History of Present Illness           Patient presents today for complete physical.  Immunizations: due for tetanus Diet: improved Exercise: needs more Wt Readings from Last 3 Encounters:  05/25/23 206 lb 9.6 oz (93.7 kg)  10/10/22 199 lb (90.3 kg)  04/12/21 187 lb (84.8 kg)  Pap Smear: scheduled Vision: up to date Dental: up to date    Health Maintenance Due  Topic Date Due   PAP-Cervical Cytology Screening  Never done   PAP SMEAR-Modifier  Never done   DTaP/Tdap/Td (7 - Td or Tdap) 06/17/2022    Past Medical History:  Diagnosis Date   Allergy    Bicuspid aortic valve determined by imaging 07/18/2019   BMI (body mass index), pediatric, 85% to less than 95% for age 61/11/2014   Fatigue 08/28/2017   Migraine without aura and without status migrainosus, not intractable 03/03/2018   Overweight 10/12/2013   Shortness of breath 06/20/2019   Stabbing chest pain 06/20/2019   Tension headache 03/24/2018    Past Surgical History:  Procedure Laterality Date   NO PAST SURGERIES      Family History  Problem Relation Age of Onset   Diabetes Mother        pre diabetic   Thyroid disease Mother        hashimotos - synthroid   Diabetes Maternal Grandfather    Cancer Paternal Grandmother        asbestos   Thyroid disease Maternal Aunt    Migraines Maternal Aunt    Alcohol abuse Neg Hx    Arthritis Neg Hx    Asthma Neg Hx    Birth defects Neg Hx    COPD Neg Hx    Depression Neg Hx    Drug abuse Neg Hx    Early death Neg Hx    Hearing loss Neg Hx    Heart disease Neg Hx    Hyperlipidemia Neg Hx    Hypertension Neg Hx    Kidney disease Neg Hx    Learning disabilities Neg Hx     Mental illness Neg Hx    Mental retardation Neg Hx    Miscarriages / Stillbirths Neg Hx    Stroke Neg Hx    Vision loss Neg Hx    Varicose Veins Neg Hx    Seizures Neg Hx    Autism Neg Hx    ADD / ADHD Neg Hx    Anxiety disorder Neg Hx    Bipolar disorder Neg Hx    Schizophrenia Neg Hx     Social History   Socioeconomic History   Marital status: Single    Spouse name: Not on file   Number of children: Not on file   Years of education: Not on file   Highest education level: Not on file  Occupational History   Not on file  Tobacco Use   Smoking status: Never   Smokeless tobacco: Never  Vaping Use   Vaping Use: Never used  Substance and Sexual Activity   Alcohol use: No   Drug use: No   Sexual activity: Never    Birth control/protection:  Abstinence  Other Topics Concern   Not on file  Social History Narrative   12th grade at [employer]. She does well in school.    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   Has a cat at home       Social Determinants of Health   Financial Resource Strain: Low Risk  (09/17/2018)   Overall Financial Resource Strain (CARDIA)    Difficulty of Paying Living Expenses: Not hard at all  Food Insecurity: No Food Insecurity (09/17/2018)   Hunger Vital Sign    Worried About Running Out of Food in the Last Year: Never true    Ran Out of Food in the Last Year: Never true  Transportation Needs: No Transportation Needs (09/17/2018)   PRAPARE - Administrator, Civil Service (Medical): No    Lack of Transportation (Non-Medical): No  Physical Activity: Insufficiently Active (10/10/2022)   Exercise Vital Sign    Days of Exercise per Week: 1 day    Minutes of Exercise per Session: 50 min  Stress: Stress Concern Present (09/17/2018)   [employer] of Occupational Health - Occupational Stress Questionnaire     Feeling of Stress : To some extent  Social Connections: Unknown (09/17/2018)   Social Connection and Isolation Panel [NHANES]    Frequency of Communication with Friends and Family: More than three times a week    Frequency of Social Gatherings with Friends and Family: More than three times a week    Attends Religious Services: More than 4 times per year    Active Member of [employer] or Organizations: Yes    Attends Banker Meetings: More than 4 times per year    Marital Status: Not on file  Intimate Partner Violence: Not At Risk (09/17/2018)   Humiliation, Afraid, Rape, and Kick questionnaire    Fear of Current or Ex-Partner: No    Emotionally Abused: No    Physically Abused: No    Sexually Abused: No    Outpatient Medications Prior to Visit  Medication Sig Dispense Refill   levocetirizine (XYZAL) 5 MG tablet Take 1 tablet by mouth daily.     norethindrone (MICRONOR) 0.35 MG tablet Take 1 tablet by mouth daily.     No facility-administered medications prior to visit.    Allergies  Allergen Reactions   Amoxicillin Itching    Review of Systems  Constitutional:  Negative for weight loss.  HENT:  Negative for congestion and hearing loss.   Eyes:  Negative for blurred vision.  Respiratory:  Negative for cough.   Cardiovascular:  Negative for leg swelling.  Gastrointestinal:  Negative for constipation and diarrhea.  Genitourinary:  Negative for dysuria, frequency and hematuria.  Musculoskeletal:  Negative for joint pain and myalgias.  Skin:  Negative for rash.  Neurological:  Positive for headaches (migraines 1-2 x a month).  Psychiatric/Behavioral:         Denies depression/anxiety       Objective:    Physical Exam   BP 124/72   Pulse 95   Resp 18   Ht 5\' 6"  (1.676 m)   Wt 206 lb 9.6 oz (93.7 kg)   LMP 05/11/2023   SpO2 99%   BMI 33.35 kg/m  Wt Readings from Last 3 Encounters:  05/25/23 206 lb 9.6 oz (93.7 kg)  10/10/22 199 lb (90.3 kg)  04/12/21 187  lb (84.8 kg)  Physical Exam  Constitutional: She is oriented to person, place, and time. She appears well-developed and well-nourished. No distress.  HENT:  Head: Normocephalic and atraumatic.  Right Ear: Tympanic membrane and ear canal normal.  Left Ear: Tympanic membrane and ear canal normal.  Mouth/Throat: Oropharynx is clear and moist.  Eyes: Pupils are equal, round, and reactive to light. No scleral icterus.  Neck: Normal range of motion. No thyromegaly present.  Cardiovascular: Normal rate and regular rhythm.   No murmur heard. Pulmonary/Chest: Effort normal and breath sounds normal. No respiratory distress. He has no wheezes. She has no rales. She exhibits no tenderness.  Abdominal: Soft. Bowel sounds are normal. She exhibits no distension and no mass. There is no tenderness. There is no rebound and no guarding.  Musculoskeletal: She exhibits no edema.  Lymphadenopathy:    She has no cervical adenopathy.  Neurological: She is alert and oriented to person, place, and time. She has normal patellar reflexes. She exhibits normal muscle tone. Coordination normal.  Skin: Skin is warm and dry.  Psychiatric: She has a normal mood and affect. Her behavior is normal. Judgment and thought content normal.  Breast/Pelvic: deferred        Assessment & Plan:       Assessment & Plan:   Problem List Items Addressed This Visit   None   I am having Brittley G. Gillian maintain her norethindrone and levocetirizine.  No orders of the defined types were placed in this encounter.

## 2023-05-25 NOTE — Patient Instructions (Signed)
VISIT SUMMARY:  Dear Elaine Wood, thank you for coming in for your physical. We discussed your renal artery stenosis, migraines, and pre-menstrual lymphadenopathy. We also reviewed your immunizations and general health maintenance.  YOUR PLAN:  -RENAL ARTERY STENOSIS: This is a condition where the arteries that carry blood to your kidneys become narrow. We will order a renal artery ultrasound to further evaluate this condition.  -MIGRAINES: You are experiencing headaches that cause blurriness and eye pain. We will continue your current management with ibuprofen and consider alternative treatments if the frequency of your migraines increases.  -IMMUNIZATIONS: We updated your tetanus shot today and recommend that you get a flu shot and COVID booster in the fall.  -GENERAL HEALTH MAINTENANCE: You have a GYN appointment scheduled in a month for a Pap smear. Your vision and dental check-ups are up-to-date. Please continue with your scheduled GYN appointment.  -RENAL FUNCTION: Your last labs showed normal kidney function. We will order labs to check your current kidney function and glucose levels due to your history of fluctuating blood pressure related to renal artery stenosis.  INSTRUCTIONS:  Please follow up in 6 months to monitor your blood pressure due to renal artery stenosis. Also, remember to get your flu shot and COVID booster in the fall.

## 2023-05-25 NOTE — Assessment & Plan Note (Addendum)
Discussed healthy diet, exercise, weight loss.  She will schedule pap with GYN. Update tetanus today.

## 2023-05-25 NOTE — Assessment & Plan Note (Signed)
Stable with prn ibuprofen.  Declines imitrex for now- happy with prn ibuprofen.

## 2023-05-25 NOTE — Assessment & Plan Note (Addendum)
She did not update Renal artery Ultrasound last year. Will re-order.Blood pressure looks good today.

## 2023-06-23 ENCOUNTER — Encounter: Payer: Self-pay | Admitting: Medical

## 2023-06-23 ENCOUNTER — Other Ambulatory Visit (HOSPITAL_BASED_OUTPATIENT_CLINIC_OR_DEPARTMENT_OTHER): Payer: Self-pay

## 2023-06-23 ENCOUNTER — Ambulatory Visit: Payer: Commercial Managed Care - PPO | Admitting: Medical

## 2023-06-23 VITALS — Ht 66.0 in | Wt 204.0 lb

## 2023-06-23 DIAGNOSIS — N920 Excessive and frequent menstruation with regular cycle: Secondary | ICD-10-CM

## 2023-06-23 MED ORDER — NORETHINDRONE 0.35 MG PO TABS
1.0000 | ORAL_TABLET | Freq: Every day | ORAL | 11 refills | Status: DC
Start: 2023-06-23 — End: 2024-06-17
  Filled 2023-06-23: qty 28, 28d supply, fill #0
  Filled 2023-07-06: qty 84, 84d supply, fill #0
  Filled 2023-10-07: qty 84, 84d supply, fill #1
  Filled 2024-01-06: qty 84, 84d supply, fill #2
  Filled 2024-03-28: qty 84, 84d supply, fill #3

## 2023-06-23 NOTE — Progress Notes (Signed)
   History:  [name] is a 22 y.o. [id number] who presents to clinic today requesting refill for POPs. Patient has been on Mirconor. She states regular periods without inter-menstrual bleeding. She would like to continue on Micronor at this time. She is due for pap smear, but unable to stay today due to work. She agrees to return soon.   The following portions of the patient's history were reviewed and updated as appropriate: allergies, current medications, family history, past medical history, social history, past surgical history and problem list.  Review of Systems:  Review of Systems  Gastrointestinal:  Negative for abdominal pain.  Genitourinary:        Neg - vaginal bleeding      Objective:  Physical Exam Ht 5\' 6"  (1.676 m)   Wt 204 lb (92.5 kg)   LMP 05/11/2023 (Exact Date)   BMI 32.93 kg/m  Physical Exam Vitals and nursing note reviewed. Exam conducted with a chaperone present.  Constitutional:      General: She is not in acute distress.    Appearance: Normal appearance. She is well-developed and normal weight.  HENT:     Head: Normocephalic and atraumatic.  Neck:     Thyroid: No thyromegaly.  Cardiovascular:     Rate and Rhythm: Normal rate and regular rhythm.     Heart sounds: No murmur heard. Pulmonary:     Effort: Pulmonary effort is normal. No respiratory distress.     Breath sounds: Normal breath sounds. No wheezing.  Abdominal:     General: Abdomen is flat. Bowel sounds are normal. There is no distension.     Palpations: Abdomen is soft. There is no mass.     Tenderness: There is no abdominal tenderness. There is no guarding or rebound.  Musculoskeletal:     Cervical back: Neck supple.  Skin:    General: Skin is warm and dry.     Findings: No erythema.  Neurological:     Mental Status: She is alert and oriented to person, place, and time.  Psychiatric:        Mood and Affect: Mood normal.     Health Maintenance Due  Topic Date Due    PAP-Cervical Cytology Screening  Never done   PAP SMEAR-Modifier  Never done    Labs, imaging and previous visits in Epic and Care Everywhere reviewed  Assessment & Plan:  1. Menorrhagia with regular cycle - norethindrone (MICRONOR) 0.35 MG tablet; Take 1 tablet (0.35 mg total) by mouth daily.  Dispense: 28 tablet; Refill: 11 - Patient will return for pap smear as soon as possible   Marny Lowenstein, PA-C 06/23/2023 11:39 AM

## 2023-06-23 NOTE — Progress Notes (Signed)
Pt here for birth control refill

## 2023-06-25 ENCOUNTER — Other Ambulatory Visit (HOSPITAL_BASED_OUTPATIENT_CLINIC_OR_DEPARTMENT_OTHER): Payer: Self-pay

## 2023-07-06 ENCOUNTER — Other Ambulatory Visit (HOSPITAL_BASED_OUTPATIENT_CLINIC_OR_DEPARTMENT_OTHER): Payer: Self-pay

## 2023-07-09 ENCOUNTER — Ambulatory Visit (HOSPITAL_BASED_OUTPATIENT_CLINIC_OR_DEPARTMENT_OTHER)
Admission: RE | Admit: 2023-07-09 | Discharge: 2023-07-09 | Disposition: A | Discharge status: Home / Self Care | Payer: Commercial Managed Care - PPO | Source: Ambulatory Visit | Attending: Family | Admitting: Family

## 2023-07-09 DIAGNOSIS — I701 Atherosclerosis of renal artery: Secondary | ICD-10-CM | POA: Diagnosis not present

## 2023-07-09 DIAGNOSIS — I10 Essential (primary) hypertension: Secondary | ICD-10-CM | POA: Insufficient documentation

## 2023-07-11 ENCOUNTER — Telehealth: Payer: Self-pay | Admitting: Family

## 2023-07-11 DIAGNOSIS — I350 Nonrheumatic aortic (valve) stenosis: Secondary | ICD-10-CM

## 2023-07-11 NOTE — Telephone Encounter (Signed)
See mychart.  

## 2023-07-14 ENCOUNTER — Telehealth: Payer: Self-pay | Admitting: Family

## 2023-07-14 DIAGNOSIS — Q230 Congenital stenosis of aortic valve: Secondary | ICD-10-CM

## 2023-07-14 NOTE — Telephone Encounter (Signed)
Cardiothoracic has declined referral and recommends cardiology referral. Referral placed.

## 2023-07-22 ENCOUNTER — Ambulatory Visit: Payer: Commercial Managed Care - PPO | Admitting: Vascular Surgery

## 2023-07-22 ENCOUNTER — Encounter: Payer: Self-pay | Admitting: Vascular Surgery

## 2023-07-22 VITALS — BP 132/94 | HR 85 | Temp 98.4°F | Resp 14 | Ht 66.0 in | Wt 202.0 lb

## 2023-07-22 DIAGNOSIS — I701 Atherosclerosis of renal artery: Secondary | ICD-10-CM

## 2023-07-22 NOTE — Progress Notes (Signed)
Patient ID: Elaine Wood, female   DOB: November 27, 2000, 22 y.o.   MRN: [id number]  Reason for Consult: Renal Artery Stenosis   Referred by Elaine Craze, NP  Subjective:     HPI:  Elaine Wood is a 22 y.o. female has a history of a bicuspid aortic valve and episodic hypertension for which she takes no medications.  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  She is a Buyer, retail of [employer].  She states that her blood pressure has been very well-controlled recently.  No previous vascular disease and no family history of vascular disease.  Past Medical History:  Diagnosis Date   Allergy    Bicuspid aortic valve determined by imaging 07/18/2019   BMI (body mass index), pediatric, 85% to less than 95% for age 28/11/2014   Fatigue 08/28/2017   Migraine without aura and without status migrainosus, not intractable 03/03/2018   Overweight 10/12/2013   Shortness of breath 06/20/2019   Stabbing chest pain 06/20/2019   Tension headache 03/24/2018   Family History  Problem Relation Age of Onset   Diabetes Mother        pre diabetic   Thyroid disease Mother        hashimotos - synthroid   Diabetes Maternal Grandfather    Cancer Paternal Grandmother        asbestos   Thyroid disease Maternal Aunt    Migraines Maternal Aunt    Alcohol abuse Neg Hx    Arthritis Neg Hx    Asthma Neg Hx    Birth defects Neg Hx    COPD Neg Hx    Depression Neg Hx    Drug abuse Neg Hx    Early death Neg Hx    Hearing loss Neg Hx    Heart disease Neg Hx    Hyperlipidemia Neg Hx    Hypertension Neg Hx    Kidney disease Neg Hx    Learning disabilities Neg Hx    Mental illness Neg Hx    Mental retardation Neg Hx    Miscarriages / Stillbirths Neg Hx    Stroke Neg Hx    Vision loss Neg Hx    Varicose Veins Neg Hx    Seizures Neg Hx    Autism Neg Hx    ADD / ADHD Neg Hx    Anxiety disorder Neg Hx    Bipolar disorder Neg Hx    Schizophrenia Neg Hx    Past Surgical  History:  Procedure Laterality Date   NO PAST SURGERIES      Short Social History:  Social History   Tobacco Use   Smoking status: Never   Smokeless tobacco: Never  Substance Use Topics   Alcohol use: Not Currently    Allergies  Allergen Reactions   Amoxicillin Itching    Current Outpatient Medications  Medication Sig Dispense Refill   levocetirizine (XYZAL) 5 MG tablet Take 1 tablet by mouth daily.     norethindrone (MICRONOR) 0.35 MG tablet Take 1 tablet (0.35 mg total) by mouth daily. 28 tablet 11   No current facility-administered medications for this visit.    Review of Systems  Constitutional:  Constitutional negative. HENT: HENT negative.  Eyes: Eyes negative.  Respiratory: Respiratory negative.  Cardiovascular: Cardiovascular negative.  GI: Gastrointestinal negative.  Musculoskeletal: Musculoskeletal negative.  Skin: Skin negative.  Neurological: Neurological negative. Hematologic: Hematologic/lymphatic negative.  Psychiatric: Psychiatric negative.        Objective:  Objective  Vitals:   07/22/23 1056  BP: (!) 132/94  Pulse: 85  Resp: 14  Temp: 98.4 F (36.9 C)  TempSrc: Temporal  SpO2: 98%  Weight: 202 lb (91.6 kg)  Height: 5\' 6"  (1.676 m)   Body mass index is 32.6 kg/m.  Physical Exam HENT:     Head: Normocephalic.     Nose: Nose normal.  Eyes:     Pupils: Pupils are equal, round, and reactive to light.  Neck:     Vascular: No carotid bruit.  Cardiovascular:     Rate and Rhythm: Normal rate.     Heart sounds: Normal heart sounds.  Pulmonary:     Effort: Pulmonary effort is normal.  Abdominal:     General: Abdomen is flat.     Palpations: Abdomen is soft. There is no mass.  Musculoskeletal:        General: Normal range of motion.     Right lower leg: No edema.     Left lower leg: No edema.  Skin:    General: Skin is warm.     Capillary Refill: Capillary refill takes less than 2 seconds.  Neurological:     General: No focal  deficit present.     Mental Status: She is alert.  Psychiatric:        Mood and Affect: Mood normal.        Thought Content: Thought content normal.        Judgment: Judgment normal.     Data: Duplex Findings:  +--------------------+--------+--------+------+------------+  Mesenteric         PSV cm/sEDV cm/sPlaque  Comments    +--------------------+--------+--------+------+------------+  Aorta Prox            194                 1.5 x 1.5 cm  +--------------------+--------+--------+------+------------+  Aorta Mid             163                 1.1 x 1.3 cm  +--------------------+--------+--------+------+------------+  Aorta Distal          146                 1.1 x 1.0 cm  +--------------------+--------+--------+------+------------+  Celiac Artery Origin  122                               +--------------------+--------+--------+------+------------+  SMA Origin            139      20                       +--------------------+--------+--------+------+------------+           +------------------+--------+--------+-------+  Right Renal ArteryPSV cm/sEDV cm/sComment  +------------------+--------+--------+-------+  Origin             174      63            +------------------+--------+--------+-------+  Proximal           167      56            +------------------+--------+--------+-------+  Mid                142      62            +------------------+--------+--------+-------+  Distal  136      53            +------------------+--------+--------+-------+   +-----------------+--------+--------+-------+  Left Renal ArteryPSV cm/sEDV cm/sComment  +-----------------+--------+--------+-------+  Origin            201      35            +-----------------+--------+--------+-------+  Proximal          198      60            +-----------------+--------+--------+-------+  Mid               112       45            +-----------------+--------+--------+-------+  Distal            125      42            +-----------------+--------+--------+-------+   +------------+--------+--------+----+-----------+--------+--------+----+  Right KidneyPSV cm/sEDV cm/sRI  Left KidneyPSV cm/sEDV cm/sRI    +------------+--------+--------+----+-----------+--------+--------+----+  Upper Pole  33      15      0.55Upper Pole 36      15      0.58  +------------+--------+--------+----+-----------+--------+--------+----+  Mid        34      14      0.        26      12      0.54  +------------+--------+--------+----+-----------+--------+--------+----+  Lower Pole  50      20      0.60Lower Pole 29      11      0.62  +------------+--------+--------+----+-----------+--------+--------+----+  Hilar      50      20      0.60Hilar      34      12      0.65  +------------+--------+--------+----+-----------+--------+--------+----+   +------------------+--------+------------------+--------+  Right Kidney              Left Kidney                 +------------------+--------+------------------+--------+  RAR                      RAR                         +------------------+--------+------------------+--------+  RAR (manual)      0.89    RAR (manual)      1.03      +------------------+--------+------------------+--------+  Cortex                   Cortex                      +------------------+--------+------------------+--------+  Cortex thickness  10.00 mmCorex thickness   11.00 mm  +------------------+--------+------------------+--------+  Kidney length (cm)11.90   Kidney length (cm)11.90     +------------------+--------+------------------+--------+     Summary:  Largest Aortic Diameter: 1.5 cm    Renal:    Right: Normal size right kidney. Normal right Resisitive Index.         Normal cortical thickness of right  kidney. 1-59% stenosis of         the right renal artery. RRV flow present.  Left:  Normal size of left kidney. Normal left Resistive Index.         Normal cortical thickness of the left kidney. 1-59% stenosis  of the left renal artery. LRV flow present.  Mesenteric:  Normal Celiac artery and Superior Mesenteric artery findings.  No obvious plaque is noted at the proximal aorta segment: however,  elevated velocities suggest >50% stenosis.         Assessment/Plan:    22 year old female with bilateral 1 to 59% renal artery stenosis.  Kidney sizes are preserved and resistive indices are low to suggest healthy kidneys.  Unsure why she would have any renal artery stenosis possibly underlying fibromuscular dysplasia.  Given that she is asymptomatic we will follow her up in 3 years with repeat renal artery duplex unless she has issues prior to this.     Maeola Harman MD [facility]

## 2023-08-05 NOTE — Progress Notes (Signed)
Cardiology Office Note:    Date:  08/19/2023   ID:  Ewell Poe, DOB 10/19/2001, MRN [id number]  PCP:  Sandford Craze, NP    HeartCare Providers Cardiologist:  Parke Poisson, MD     Referring MD: Sandford Craze, NP   Chief Complaint  Patient presents with   Follow-up    Bicuspid aortic valve    History of Present Illness:    Elaine Wood is a 22 y.o. female with a hx of bicuspid aortic valve and HTN on no medications. She follows with [facility] for renal artery stenosis of 1-59% bilaterally.  She established care with cardiology in 2020 for chest discomfort found to have a bicuspid aortic valve.  She has episodic hypertension and takes no medications.  Echocardiogram 06/2019 showed normal LVEF 60-65% and no valvular abnormalities with the exception of indeterminate number of cusps of the aortic valve.  Coronary CTA 06/2019 showed no abnormalities but a functional bicuspid aortic valve with no aortic stenosis, AVA was 3.2 cm.  She had a normal aortic root and arch vessels with no coarctation.  Ascending aortic root 2.6 cm and normal alignment arteries.  She was last seen in clinic by Dr. Jacques Navy 03/2021 and was doing well from a cardiac perspective. She does have occasional symptomatic hypotension.   She presents today for annual follow up. She has no cardiology complaints. 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 She is not routinely checking BP or exercising. We discussed both of these. Dr. Randie Heinz has not recommended CT scan for fibromuscular dyplasia, will follow up with her in 3 years.   Past Medical History:  Diagnosis Date   Allergy    Bicuspid aortic valve determined by imaging 07/18/2019   BMI (body mass index), pediatric, 85% to less than 95% for age 65/11/2014   Fatigue 08/28/2017   Migraine without aura and without status migrainosus, not intractable 03/03/2018   Overweight 10/12/2013   Shortness of breath 06/20/2019   Stabbing  chest pain 06/20/2019   Tension headache 03/24/2018    Past Surgical History:  Procedure Laterality Date   NO PAST SURGERIES      Current Medications: No outpatient medications have been marked as taking for the 08/19/23 encounter (Office Visit) with Marcelino Duster, PA.     Allergies:   Amoxicillin   Social History   Socioeconomic History   Marital status: Single    Spouse name: Not on file   Number of children: Not on file   Years of education: Not on file   Highest education level: Not on file  Occupational History   Not on file  Tobacco Use   Smoking status: Never   Smokeless tobacco: Never  Vaping Use   Vaping status: Never Used  Substance and Sexual Activity   Alcohol use: Not Currently   Drug use: Never   Sexual activity: Never    Birth control/protection: Pill  Other Topics Concern   Not on file  Social History Narrative   12th grade at [employer]. She does well in school.    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   Has a cat at home          Social Determinants of Health   Financial Resource Strain: Low Risk  (09/17/2018)   Overall Financial Resource Strain (CARDIA)  Difficulty of Paying Living Expenses: Not hard at all  Food Insecurity: No Food Insecurity (09/17/2018)   Hunger Vital Sign    Worried About Running Out of Food in the Last Year: Never true    Ran Out of Food in the Last Year: Never true  Transportation Needs: No Transportation Needs (09/17/2018)   PRAPARE - Administrator, Civil Service (Medical): No    Lack of Transportation (Non-Medical): No  Physical Activity: Insufficiently Active (10/10/2022)   Exercise Vital Sign    Days of Exercise per Week: 1 day    Minutes of Exercise per Session: 50 min  Stress: Stress Concern Present (09/17/2018)   [employer] of Occupational  Health - Occupational Stress Questionnaire    Feeling of Stress : To some extent  Social Connections: Unknown (09/17/2018)   Social Connection and Isolation Panel [NHANES]    Frequency of Communication with Friends and Family: More than three times a week    Frequency of Social Gatherings with Friends and Family: More than three times a week    Attends Religious Services: More than 4 times per year    Active Member of [employer] or Organizations: Yes    Attends Engineer, structural: More than 4 times per year    Marital Status: Not on file     Family History: The patient's family history includes Cancer in her paternal grandmother; Diabetes in her maternal grandfather and mother; Migraines in her maternal aunt; Thyroid disease in her maternal aunt and mother. There is no history of Alcohol abuse, Arthritis, Asthma, Birth defects, COPD, Depression, Drug abuse, Early death, Hearing loss, Heart disease, Hyperlipidemia, Hypertension, Kidney disease, Learning disabilities, Mental illness, Mental retardation, Miscarriages / Stillbirths, Stroke, Vision loss, Varicose Veins, Seizures, Autism, ADD / ADHD, Anxiety disorder, Bipolar disorder, or Schizophrenia.  ROS:   Please see the history of present illness.     All other systems reviewed and are negative.  EKGs/Labs/Other Studies Reviewed:    The following studies were reviewed today:  Echo 07/04/2019: 1. The left ventricle has normal systolic function with an ejection  fraction of 60-65%. The cavity size was normal. Left ventricular diastolic  parameters were normal.   2. The right ventricle has normal systolic function. The cavity was  normal. There is no increase in right ventricular wall thickness. Right  ventricular systolic pressure is normal with an estimated pressure of 17.5  mmHg.   3. The aorta is normal in size and structure.   4. There is mild mitral regurgitation and trivial pulmonic, tricuspid and  aortic regurgitation.    5. The aortic valve has an indeterminate number of cusps. Aortic valve  regurgitation is trivial by color flow Doppler. Possible bicuspid aortic  valve.    EKG Interpretation Date/Time:  Wednesday August 19 2023 08:07:39 EDT Ventricular Rate:  78 PR Interval:  138 QRS Duration:  98 QT Interval:  384 QTC Calculation: 437 R Axis:   82  Text Interpretation: Normal sinus rhythm with sinus arrhythmia Normal ECG No previous ECGs available Confirmed by Micah Flesher ([id number]) on 08/19/2023 8:12:24 AM    Recent Labs: 05/25/2023: ALT 34; BUN 11; Creatinine, Ser 0.58; Potassium 4.0; Sodium 136  Recent Lipid Panel No results found for: "CHOL", "TRIG", "HDL", "CHOLHDL", "VLDL", "LDLCALC", "LDLDIRECT"   Risk Assessment/Calculations:                Physical Exam:    VS:  BP 132/88 (BP Location: Left Arm, Patient Position: Sitting, Cuff  Size: Normal)   Pulse 78   Ht 5\' 6"  (1.676 m)   Wt 202 lb 6.4 oz (91.8 kg)   SpO2 94%   BMI 32.67 kg/m     Wt Readings from Last 3 Encounters:  08/19/23 202 lb 6.4 oz (91.8 kg)  07/22/23 202 lb (91.6 kg)  06/23/23 204 lb (92.5 kg)     GEN:  Well nourished, well developed in no acute distress HEENT: Normal NECK: No JVD; No carotid bruits LYMPHATICS: No lymphadenopathy CARDIAC: RRR, no murmurs, rubs, gallops RESPIRATORY:  Clear to auscultation without rales, wheezing or rhonchi  ABDOMEN: Soft, non-tender, non-distended MUSCULOSKELETAL:  No edema; No deformity  SKIN: Warm and dry NEUROLOGIC:  Alert and oriented x 3 PSYCHIATRIC:  Normal affect   ASSESSMENT:    1. Bicuspid aortic valve determined by imaging   2. Renal artery stenosis (HCC)   3. Obesity (BMI 30.0-34.9)   4. Labile blood pressure    PLAN:    In order of problems listed above:  Bicuspid aortic valve - no aortic aneurysm on CTA - no SOB, palpitations - last echo 2023 - repeat Sept 2025 with follow up   Labile BP Episodic hypertension Symptomatic hypotension - home  monitoring - she will obtain BP cuff - recent renal US with 1-59% RAS, followed by Dr. Randie Heinz - I have encouraged low sodium diet with < 2g sodium daily and BP log - she willc all if consistently greater than 135 systolic   Follow up in 1 year.           Medication Adjustments/Labs and Tests Ordered: Current medicines are reviewed at length with the patient today.  Concerns regarding medicines are outlined above.  Orders Placed This Encounter  Procedures   EKG 12-Lead   No orders of the defined types were placed in this encounter.   Patient Instructions  Medication Instructions:  Your physician recommends that you continue on your current medications as directed. Please refer to the Current Medication list given to you today.  *If you need a refill on your cardiac medications before your next appointment, please call your pharmacy*   Lab Work: NONE ordered at this time of appointment   Testing/Procedures: Your physician has requested that you have an echocardiogram 07/2024. Echocardiography is a painless test that uses sound waves to create images of your heart. It provides your doctor with information about the size and shape of your heart and how well your heart's chambers and valves are working. This procedure takes approximately one hour. There are no restrictions for this procedure. Please do NOT wear cologne, perfume, aftershave, or lotions (deodorant is allowed). Please arrive 15 minutes prior to your appointment time.    Follow-Up: At [facility], you and your health needs are our priority.  As part of our continuing mission to provide you with exceptional heart care, we have created designated Provider Care Teams.  These Care Teams include your primary Cardiologist (physician) and Advanced Practice Providers (APPs -  Physician Assistants and Nurse Practitioners) who all work together to provide you with the care you need, when you need it.  We recommend  signing up for the patient portal called "MyChart".  Sign up information is provided on this After Visit Summary.  MyChart is used to connect with patients for Virtual Visits (Telemedicine).  Patients are able to view lab/test results, encounter notes, upcoming appointments, etc.  Non-urgent messages can be sent to your provider as well.   To learn more  about what you can do with MyChart, go to [email or web address].    Your next appointment:   1 year(s) (after Echocardiogram 07/2024)  Provider:   Parke Poisson, MD     Other Instructions Monitor Blood Pressure. Report Systolic BP (top number) if it's consistently greater than 135. Discussed Omron BP Cuff    Signed, Marcelino Duster, [place]  08/19/2023 8:32 AM    [facility]

## 2023-08-19 ENCOUNTER — Encounter: Payer: Self-pay | Admitting: Physician Assistant

## 2023-08-19 ENCOUNTER — Ambulatory Visit: Payer: Commercial Managed Care - PPO | Attending: Physician Assistant | Admitting: Physician Assistant

## 2023-08-19 VITALS — BP 132/88 | HR 78 | Ht 66.0 in | Wt 202.4 lb

## 2023-08-19 DIAGNOSIS — E669 Obesity, unspecified: Secondary | ICD-10-CM | POA: Diagnosis not present

## 2023-08-19 DIAGNOSIS — R0989 Other specified symptoms and signs involving the circulatory and respiratory systems: Secondary | ICD-10-CM | POA: Diagnosis not present

## 2023-08-19 DIAGNOSIS — Q231 Congenital insufficiency of aortic valve: Secondary | ICD-10-CM | POA: Diagnosis not present

## 2023-08-19 DIAGNOSIS — I701 Atherosclerosis of renal artery: Secondary | ICD-10-CM | POA: Diagnosis not present

## 2023-08-19 NOTE — Addendum Note (Signed)
Addended by: Lamar Benes on: 08/19/2023 08:36 AM   Modules accepted: Orders

## 2023-08-19 NOTE — Patient Instructions (Addendum)
Medication Instructions:  Your physician recommends that you continue on your current medications as directed. Please refer to the Current Medication list given to you today.  *If you need a refill on your cardiac medications before your next appointment, please call your pharmacy*   Lab Work: NONE ordered at this time of appointment   Testing/Procedures: Your physician has requested that you have an echocardiogram 07/2024. Echocardiography is a painless test that uses sound waves to create images of your heart. It provides your doctor with information about the size and shape of your heart and how well your heart's chambers and valves are working. This procedure takes approximately one hour. There are no restrictions for this procedure. Please do NOT wear cologne, perfume, aftershave, or lotions (deodorant is allowed). Please arrive 15 minutes prior to your appointment time.    Follow-Up: At [facility], you and your health needs are our priority.  As part of our continuing mission to provide you with exceptional heart care, we have created designated Provider Care Teams.  These Care Teams include your primary Cardiologist (physician) and Advanced Practice Providers (APPs -  Physician Assistants and Nurse Practitioners) who all work together to provide you with the care you need, when you need it.  We recommend signing up for the patient portal called "MyChart".  Sign up information is provided on this After Visit Summary.  MyChart is used to connect with patients for Virtual Visits (Telemedicine).  Patients are able to view lab/test results, encounter notes, upcoming appointments, etc.  Non-urgent messages can be sent to your provider as well.   To learn more about what you can do with MyChart, go to [email or web address].    Your next appointment:   1 year(s) (after Echocardiogram 07/2024)  Provider:   Parke Poisson, MD     Other Instructions Monitor Blood Pressure.  Report Systolic BP (top number) if it's consistently greater than 135. Discussed Omron BP Cuff

## 2023-11-27 ENCOUNTER — Ambulatory Visit: Payer: Commercial Managed Care - PPO | Admitting: Family

## 2023-12-01 ENCOUNTER — Ambulatory Visit: Payer: Commercial Managed Care - PPO | Admitting: Family

## 2023-12-02 ENCOUNTER — Ambulatory Visit: Payer: Commercial Managed Care - PPO | Admitting: Family

## 2023-12-29 ENCOUNTER — Other Ambulatory Visit (HOSPITAL_BASED_OUTPATIENT_CLINIC_OR_DEPARTMENT_OTHER): Payer: Self-pay

## 2023-12-29 ENCOUNTER — Ambulatory Visit: Payer: Commercial Managed Care - PPO | Admitting: Family Medicine

## 2023-12-29 ENCOUNTER — Encounter: Payer: Self-pay | Admitting: Family Medicine

## 2023-12-29 VITALS — BP 130/98 | HR 119 | Temp 99.2°F | Resp 18 | Ht 66.0 in | Wt 205.0 lb

## 2023-12-29 DIAGNOSIS — R6889 Other general symptoms and signs: Secondary | ICD-10-CM | POA: Diagnosis not present

## 2023-12-29 DIAGNOSIS — J101 Influenza due to other identified influenza virus with other respiratory manifestations: Secondary | ICD-10-CM | POA: Diagnosis not present

## 2023-12-29 LAB — POC COVID19 BINAXNOW: SARS Coronavirus 2 Ag: NEGATIVE

## 2023-12-29 LAB — POC INFLUENZA A&B (BINAX/QUICKVUE)
Influenza A, POC: POSITIVE — AB
Influenza B, POC: NEGATIVE

## 2023-12-29 MED ORDER — FLUTICASONE PROPIONATE 50 MCG/ACT NA SUSP
2.0000 | Freq: Every day | NASAL | 6 refills | Status: DC
  Filled 2023-12-29: qty 16, 30d supply, fill #0

## 2023-12-29 MED ORDER — PROMETHAZINE-DM 6.25-15 MG/5ML PO SYRP
5.0000 mL | ORAL_SOLUTION | Freq: Four times a day (QID) | ORAL | 0 refills | Status: DC | PRN
  Filled 2023-12-29: qty 118, 6d supply, fill #0

## 2023-12-29 MED ORDER — OSELTAMIVIR PHOSPHATE 75 MG PO CAPS
75.0000 mg | ORAL_CAPSULE | Freq: Two times a day (BID) | ORAL | 0 refills | Status: DC
  Filled 2023-12-29: qty 10, 5d supply, fill #0

## 2023-12-29 NOTE — Patient Instructions (Signed)

## 2023-12-29 NOTE — Progress Notes (Signed)
 Established Patient Office Visit  Subjective   Patient ID: Elaine Wood, female    DOB: 2001-01-17  Age: 23 y.o. MRN: [id number]  Chief Complaint  Patient presents with   Sinus Problem    X1 week ago, headache and cough, body aches Pt states taking [place]  OTC. No COVID test.     HPI Discussed the use of AI scribe software for clinical note transcription with the patient, who gave verbal consent to proceed.  History of Present Illness   Elaine Wood is a 23 year old female who presents with symptoms suggestive of a sinus infection.  Symptoms began approximately one week ago with a headache initially attributed to old contact lenses. Over the past few days, she developed worsening pain in her teeth, ears, and chest, along with a dry throat upon waking. She describes significant pressure under her eyes and ear discomfort. She has a low-grade fever today but has not experienced higher fevers at home. No productive cough, but she experiences choking sensations.  She has been taking Alka-Seltzer to manage her symptoms but reports no improvement. She denies using other medications such as Tylenol  or ibuprofen. She has not taken any medication today and currently does not have a fever, with a temperature of 11F.  She is allergic to amoxicillin  and possibly other penicillins.  She mentions being with her grandmother, who is in hospice care, last night and is concerned about being contagious. Despite her symptoms, she continues to work.      Patient Active Problem List   Diagnosis Date Noted   Obesity (BMI 30.0-34.9) 10/12/2022   Axillary tenderness 10/12/2022   Renal artery stenosis (HCC) 11/21/2020   Bicuspid aortic valve determined by imaging 07/18/2019   Family history of blood coagulation disorder 06/17/2019   Menorrhagia 06/17/2019   Migraine without aura and without status migrainosus, not intractable 03/03/2018   Past Medical History:  Diagnosis Date   Allergy     Bicuspid aortic valve determined by imaging 07/18/2019   BMI (body mass index), pediatric, 85% to less than 95% for age 33/11/2014   Fatigue 08/28/2017   Migraine without aura and without status migrainosus, not intractable 03/03/2018   Overweight 10/12/2013   Shortness of breath 06/20/2019   Stabbing chest pain 06/20/2019   Tension headache 03/24/2018   Past Surgical History:  Procedure Laterality Date   NO PAST SURGERIES     Social History   Tobacco Use   Smoking status: Never   Smokeless tobacco: Never  Vaping Use   Vaping status: Never Used  Substance Use Topics   Alcohol use: Not Currently   Drug use: Never   Social History   Socioeconomic History   Marital status: Single    Spouse name: Not on file   Number of children: Not on file   Years of education: Not on file   Highest education level: Not on file  Occupational History   Not on file  Tobacco Use   Smoking status: Never   Smokeless tobacco: Never  Vaping Use   Vaping status: Never Used  Substance and Sexual Activity   Alcohol use: Not Currently   Drug use: Never   Sexual activity: Never    Birth control/protection: Pill  Other Topics Concern   Not on file  Social History Narrative   12th grade at [employer]. She does well in school.    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   Has a cat at home          Social Drivers of Health   Financial Resource Strain: Low Risk  (09/17/2018)   Overall Financial Resource Strain (CARDIA)    Difficulty of Paying Living Expenses: Not hard at all  Food Insecurity: No Food Insecurity (09/17/2018)   Hunger Vital Sign    Worried About Running Out of Food in the Last Year: Never true    Ran Out of Food in the Last Year: Never true  Transportation Needs: No Transportation Needs (09/17/2018)   PRAPARE - Scientist, Research (physical Sciences) (Medical): No    Lack of Transportation (Non-Medical): No  Physical Activity: Insufficiently Active (10/10/2022)   Exercise Vital Sign    Days of Exercise per Week: 1 day    Minutes of Exercise per Session: 50 min  Stress: Stress Concern Present (09/17/2018)   [employer] of Occupational Health - Occupational Stress Questionnaire    Feeling of Stress : To some extent  Social Connections: Unknown (09/17/2018)   Social Connection and Isolation Panel [NHANES]    Frequency of Communication with Friends and Family: More than three times a week    Frequency of Social Gatherings with Friends and Family: More than three times a week    Attends Religious Services: More than 4 times per year    Active Member of [employer] or Organizations: Yes    Attends Engineer, Structural: More than 4 times per year    Marital Status: Not on file  Intimate Partner Violence: Not At Risk (09/17/2018)   Humiliation, Afraid, Rape, and Kick questionnaire    Fear of Current or Ex-Partner: No    Emotionally Abused: No    Physically Abused: No    Sexually Abused: No   Family Status  Relation Name Status   Mother Avelina Alive   Father Zachary Alive   Brother  Alive   Brother  Alive   MGM  Alive   MGF  Alive   PGM  Deceased   PGF  Deceased   Youth Worker  (Not Specified)   Neg Hx  (Not Specified)  No partnership data on file   Family History  Problem Relation Age of Onset   Diabetes Mother        pre diabetic   Thyroid  disease Mother        hashimotos - synthroid   Diabetes Maternal Grandfather    Cancer Paternal Grandmother        asbestos   Thyroid  disease Maternal Aunt    Migraines Maternal Aunt    Alcohol abuse Neg Hx    Arthritis Neg Hx    Asthma Neg Hx    Birth defects Neg Hx    COPD Neg Hx    Depression Neg Hx    Drug abuse Neg Hx    Early death Neg Hx    Hearing loss Neg Hx    Heart disease Neg Hx    Hyperlipidemia Neg Hx    Hypertension Neg Hx    Kidney  disease Neg Hx    Learning disabilities Neg Hx    Mental illness Neg Hx    Mental retardation Neg Hx    Miscarriages / Stillbirths Neg Hx    Stroke Neg Hx    Vision loss Neg Hx    Varicose Veins Neg Hx    Seizures Neg Hx  Autism Neg Hx    ADD / ADHD Neg Hx    Anxiety disorder Neg Hx    Bipolar disorder Neg Hx    Schizophrenia Neg Hx    Allergies  Allergen Reactions   Amoxicillin  Itching      Review of Systems  Constitutional:  Negative for chills, fever and malaise/fatigue.  HENT:  Negative for congestion and hearing loss.   Eyes:  Negative for blurred vision and discharge.  Respiratory:  Negative for cough, sputum production and shortness of breath.   Cardiovascular:  Negative for chest pain, palpitations and leg swelling.  Gastrointestinal:  Negative for abdominal pain, blood in stool, constipation, diarrhea, heartburn, nausea and vomiting.  Genitourinary:  Negative for dysuria, frequency, hematuria and urgency.  Musculoskeletal:  Negative for back pain, falls and myalgias.  Skin:  Negative for rash.  Neurological:  Negative for dizziness, sensory change, loss of consciousness, weakness and headaches.  Endo/Heme/Allergies:  Negative for environmental allergies. Does not bruise/bleed easily.  Psychiatric/Behavioral:  Negative for depression and suicidal ideas. The patient is not nervous/anxious and does not have insomnia.       Objective:     BP (!) 130/98 (BP Location: Left Arm, Patient Position: Sitting, Cuff Size: Normal)   Pulse (!) 119   Temp 99.2 F (37.3 C) (Oral)   Resp 18   Ht 5' 6 (1.676 m)   Wt 205 lb (93 kg)   SpO2 99%   BMI 33.09 kg/m  BP Readings from Last 3 Encounters:  12/29/23 (!) 130/98  08/19/23 132/88  07/22/23 (!) 132/94   Wt Readings from Last 3 Encounters:  12/29/23 205 lb (93 kg)  08/19/23 202 lb 6.4 oz (91.8 kg)  07/22/23 202 lb (91.6 kg)   SpO2 Readings from Last 3 Encounters:  12/29/23 99%  08/19/23 94%  07/22/23 98%       Physical Exam Vitals and nursing note reviewed.  Constitutional:      General: She is not in acute distress.    Appearance: Normal appearance. She is well-developed.  HENT:     Head: Normocephalic and atraumatic.  Eyes:     General: No scleral icterus.       Right eye: No discharge.        Left eye: No discharge.  Cardiovascular:     Rate and Rhythm: Normal rate and regular rhythm.     Heart sounds: No murmur heard. Pulmonary:     Effort: Pulmonary effort is normal. No respiratory distress.     Breath sounds: Normal breath sounds.  Musculoskeletal:        General: Normal range of motion.     Cervical back: Normal range of motion and neck supple.     Right lower leg: No edema.     Left lower leg: No edema.  Skin:    General: Skin is warm and dry.  Neurological:     Mental Status: She is alert and oriented to person, place, and time.  Psychiatric:        Mood and Affect: Mood normal.        Behavior: Behavior normal.        Thought Content: Thought content normal.        Judgment: Judgment normal.      Results for orders placed or performed in visit on 12/29/23  POC COVID-19  Result Value Ref Range   SARS Coronavirus 2 Ag Negative Negative  POC Influenza A&B (Binax test)  Result Value Ref Range  Influenza A, POC Positive (A) Negative   Influenza B, POC Negative Negative    Last CBC Lab Results  Component Value Date   WBC 5.1 11/21/2020   HGB 15.0 11/21/2020   HCT 43.1 11/21/2020   MCV 87 11/21/2020   MCH 30.2 11/21/2020   RDW 12.6 11/21/2020   PLT 222 11/21/2020   Last metabolic panel Lab Results  Component Value Date   GLUCOSE 84 05/25/2023   NA 136 05/25/2023   K 4.0 05/25/2023   CL 105 05/25/2023   CO2 22 05/25/2023   BUN 11 05/25/2023   CREATININE 0.58 05/25/2023   GFR 128.60 05/25/2023   CALCIUM 9.2 05/25/2023   PROT 6.8 05/25/2023   ALBUMIN 4.4 05/25/2023   BILITOT 0.5 05/25/2023   ALKPHOS 55 05/25/2023   AST 21 05/25/2023   ALT 34  05/25/2023   ANIONGAP 9 07/22/2019   Last lipids No results found for: CHOL, HDL, LDLCALC, LDLDIRECT, TRIG, CHOLHDL Last hemoglobin A1c Lab Results  Component Value Date   HGBA1C 5.0 11/21/2020   Last thyroid  functions Lab Results  Component Value Date   TSH 0.901 11/21/2020   Last vitamin D No results found for: 25OHVITD2, 25OHVITD3, VD25OH Last vitamin B12 and Folate Lab Results  Component Value Date   VITAMINB12 413 11/21/2020   FOLATE 5.7 11/21/2020      The ASCVD Risk score (Arnett DK, et al., 2019) failed to calculate for the following reasons:   The 2019 ASCVD risk score is only valid for ages 85 to 67    Assessment & Plan:   Problem List Items Addressed This Visit   None Visit Diagnoses       Influenza A    -  Primary   Relevant Medications   oseltamivir  (TAMIFLU ) 75 MG capsule   fluticasone  (FLONASE ) 50 MCG/ACT nasal spray     Flu-like symptoms       Relevant Medications   promethazine -dextromethorphan (PROMETHAZINE -DM) 6.25-15 MG/5ML syrup   Other Relevant Orders   POC COVID-19 (Completed)   POC Influenza A&B (Binax test) (Completed)     Assessment and Plan    Influenza Symptoms consistent with influenza include headache, dry throat, chest discomfort, and low-grade fever for one week, with contact with likely infected individuals. No significant fever today, possibly masked by Alka-Seltzer. Discussed the contagious period and the need to be fever-free for 24 hours before returning to work. Informed about Tamiflu 's efficacy in shortening symptom duration if taken early. Prescribe Tamiflu . Advise rest and hydration. Instruct to monitor symptoms and return if not improving in a few days.  Sinusitis Reports pressure under the eyes, ear discomfort, and chest pain, indicating possible sinus infection secondary to influenza. Symptoms are worsening despite Alka-Seltzer. Discussed using Flonase  for sinus pressure relief and cough syrup for chest  discomfort. Informed about potential bacterial sinus infection requiring antibiotics if symptoms do not improve. Advise use of Flonase  for sinus pressure. Prescribe cough syrup for chest discomfort. Instruct to follow up if symptoms do not improve, as antibiotics may be needed.  General Health Maintenance Emphasized the importance of monitoring fever and overall well-being. Advised staying home until fever-free for 24 hours without fever-reducing medications. Instruct to avoid contact with others to prevent infection spread.        Return if symptoms worsen or fail to improve.    Ngina Royer R Lowne Chase, DO

## 2024-06-17 ENCOUNTER — Other Ambulatory Visit (HOSPITAL_BASED_OUTPATIENT_CLINIC_OR_DEPARTMENT_OTHER): Payer: Self-pay

## 2024-06-17 ENCOUNTER — Other Ambulatory Visit: Payer: Self-pay

## 2024-06-17 DIAGNOSIS — Z01419 Encounter for gynecological examination (general) (routine) without abnormal findings: Secondary | ICD-10-CM | POA: Diagnosis not present

## 2024-06-17 DIAGNOSIS — Z309 Encounter for contraceptive management, unspecified: Secondary | ICD-10-CM | POA: Diagnosis not present

## 2024-06-17 MED ORDER — NORETHINDRONE 0.35 MG PO TABS
1.0000 | ORAL_TABLET | Freq: Every day | ORAL | 3 refills | Status: AC
  Filled 2024-06-17 (×2): qty 84, 84d supply, fill #0
  Filled 2024-09-13: qty 84, 84d supply, fill #1
  Filled 2024-12-07: qty 84, 84d supply, fill #2

## 2024-06-21 ENCOUNTER — Encounter: Payer: Self-pay | Admitting: Family

## 2024-06-21 ENCOUNTER — Telehealth: Payer: Self-pay | Admitting: Family

## 2024-06-21 ENCOUNTER — Ambulatory Visit: Payer: Self-pay | Admitting: Family

## 2024-06-21 ENCOUNTER — Ambulatory Visit (INDEPENDENT_AMBULATORY_CARE_PROVIDER_SITE_OTHER): Admitting: Family

## 2024-06-21 VITALS — BP 140/100 | HR 89 | Temp 98.8°F | Resp 16 | Ht 66.0 in | Wt 213.4 lb

## 2024-06-21 DIAGNOSIS — G43009 Migraine without aura, not intractable, without status migrainosus: Secondary | ICD-10-CM

## 2024-06-21 DIAGNOSIS — R6889 Other general symptoms and signs: Secondary | ICD-10-CM | POA: Diagnosis not present

## 2024-06-21 DIAGNOSIS — Z0000 Encounter for general adult medical examination without abnormal findings: Secondary | ICD-10-CM

## 2024-06-21 DIAGNOSIS — R42 Dizziness and giddiness: Secondary | ICD-10-CM

## 2024-06-21 DIAGNOSIS — R030 Elevated blood-pressure reading, without diagnosis of hypertension: Secondary | ICD-10-CM | POA: Diagnosis not present

## 2024-06-21 DIAGNOSIS — I701 Atherosclerosis of renal artery: Secondary | ICD-10-CM | POA: Diagnosis not present

## 2024-06-21 DIAGNOSIS — E66811 Obesity, class 1: Secondary | ICD-10-CM | POA: Diagnosis not present

## 2024-06-21 LAB — LIPID PANEL
Cholesterol: 196 mg/dL (ref 0–200)
HDL: 48.7 mg/dL (ref >39.00)
LDL Cholesterol: 124 mg/dL — ABNORMAL HIGH (ref 0–99)
NonHDL: 147.21
Total CHOL/HDL Ratio: 4
Triglycerides: 114 mg/dL (ref 0.0–149.0)
VLDL: 22.8 mg/dL (ref 0.0–40.0)

## 2024-06-21 LAB — CBC WITH DIFFERENTIAL/PLATELET
Basophils Absolute: 0 K/uL (ref 0.0–0.1)
Basophils Relative: 0.6 % (ref 0.0–3.0)
Eosinophils Absolute: 0.1 K/uL (ref 0.0–0.7)
Eosinophils Relative: 0.8 % (ref 0.0–5.0)
HCT: 43.3 % (ref 36.0–46.0)
Hemoglobin: 14.6 g/dL (ref 12.0–15.0)
Lymphocytes Relative: 28.5 % (ref 12.0–46.0)
Lymphs Abs: 2.1 K/uL (ref 0.7–4.0)
MCHC: 33.8 g/dL (ref 30.0–36.0)
MCV: 87 fl (ref 78.0–100.0)
Monocytes Absolute: 0.3 K/uL (ref 0.1–1.0)
Monocytes Relative: 4.2 % (ref 3.0–12.0)
Neutro Abs: 5 K/uL (ref 1.4–7.7)
Neutrophils Relative %: 65.9 % (ref 43.0–77.0)
Platelets: 239 K/uL (ref 150.0–400.0)
RBC: 4.97 Mil/uL (ref 3.87–5.11)
RDW: 13.1 % (ref 11.5–15.5)
WBC: 7.5 K/uL (ref 4.0–10.5)

## 2024-06-21 LAB — COMPREHENSIVE METABOLIC PANEL WITH GFR
ALT: 30 U/L (ref 0–35)
AST: 18 U/L (ref 0–37)
Albumin: 5 g/dL (ref 3.5–5.2)
Alkaline Phosphatase: 61 U/L (ref 39–117)
BUN: 13 mg/dL (ref 6–23)
CO2: 23 meq/L (ref 19–32)
Calcium: 9.5 mg/dL (ref 8.4–10.5)
Chloride: 103 meq/L (ref 96–112)
Creatinine, Ser: 0.59 mg/dL (ref 0.40–1.20)
GFR: 127.1 mL/min (ref >60.00)
Glucose, Bld: 97 mg/dL (ref 70–99)
Potassium: 3.9 meq/L (ref 3.5–5.1)
Sodium: 138 meq/L (ref 135–145)
Total Bilirubin: 0.5 mg/dL (ref 0.2–1.2)
Total Protein: 7.7 g/dL (ref 6.0–8.3)

## 2024-06-21 LAB — TSH: TSH: 0.98 u[IU]/mL (ref 0.35–5.50)

## 2024-06-21 NOTE — Telephone Encounter (Signed)
See mychart, 

## 2024-06-21 NOTE — Assessment & Plan Note (Signed)
 1-59% on US  2024.  With bp being elevated today, will update US .

## 2024-06-21 NOTE — Assessment & Plan Note (Signed)
 Stable. Recommend otc Excedrin migraine prn. If ineffective let us  know and we can try a triptan.

## 2024-06-21 NOTE — Patient Instructions (Signed)
 VISIT SUMMARY:  Today, you were seen for hot flashes, dizziness, and migraines. We discussed potential causes and management strategies for these symptoms, including lifestyle changes and possible medications.  YOUR PLAN:  DIZZINESS AND HOT FLASHES: You have been experiencing daily episodes of hot flashes and dizziness, which may be related to high blood pressure or thyroid  issues. -We will conduct thyroid  function tests and baseline blood work to investigate further.  ELEVATED BLOOD PRESSURE: Your blood pressure was measured at 140/100, which may be contributing to your symptoms of dizziness and headache. -Recheck your blood pressure in one week with the nurse. -If your blood pressure remains high, we will discuss starting antihypertensive medication. -Focus on weight loss and a low sodium diet to help manage your blood pressure.  MIGRAINE HEADACHES: You experience weekly migraines, often related to prolonged screen use. -Take Excedrin Migraine at the onset of symptoms. -If over-the-counter medications are not effective, we may consider prescription options.  OBESITY: Recent weight gain has been noted, and managing your weight is important due to your family history of diabetes. -Incorporate regular exercise into your routine. -Make dietary changes to support weight loss.

## 2024-06-21 NOTE — Assessment & Plan Note (Addendum)
 She is due for pap but GYN did not perform advising her to return after she has become sexually active.  Discussed importance of regular exercise, healthy diet.  Immunizations reviewed and up to date.

## 2024-06-21 NOTE — Assessment & Plan Note (Signed)
 BP Readings from Last 3 Encounters:  06/21/24 (!) 147/88  12/29/23 (!) 130/98  08/19/23 132/88   Repeat bp in 1 week. If still high will plan treatment.

## 2024-06-21 NOTE — Progress Notes (Signed)
 Subjective:     Patient ID: Elaine Wood, female    DOB: 01/28/2001, 23 y.o.   MRN: [id number]  Chief Complaint  Patient presents with   Annual Exam    HPI  Discussed the use of AI scribe software for clinical note transcription with the patient, who gave verbal consent to proceed.  History of Present Illness Elaine Wood is a 23 year old female who presents with hot flashes and dizziness.  She experiences daily episodes of hot flashes with sweating and a sensation of heaviness at the back of her head, typically occurring around 3 PM. Dizziness accompanies these episodes, with a sensation of near-fainting. Migraines occur approximately once a week, often related to prolonged screen exposure, and are less frequent on weekends. She manages them with hydration and over-the-counter medication if they persist beyond 24 hours. Her lifestyle includes a lack of regular exercise and recent weight gain. Her diet has been inconsistent, particularly over the last month due to circumstances involving hospice care, leading to less healthy eating habits. Her periods are regular and not as heavy as they used to be. No current cough, cold symptoms, skin rashes, leg swelling, hearing or vision issues, constipation, diarrhea, urinary issues, or unusual muscle or joint pain.  Immunizations: up to date Diet: fair diet Exercise: not currently Pap Smear: Pt deferred saw GYN Vision: up to date Dental:  due- will schedule      There are no preventive care reminders to display for this patient.   Past Medical History:  Diagnosis Date   Allergy    Bicuspid aortic valve determined by imaging 07/18/2019   BMI (body mass index), pediatric, 85% to less than 95% for age 23/11/2014   Fatigue 08/28/2017   Migraine without aura and without status migrainosus, not intractable 03/03/2018   Overweight 10/12/2013   Shortness of breath 06/20/2019   Stabbing chest pain 06/20/2019   Tension headache 03/24/2018     Past Surgical History:  Procedure Laterality Date   NO PAST SURGERIES      Family History  Problem Relation Age of Onset   Diabetes Mother        pre diabetic   Thyroid  disease Mother        hashimotos - synthroid   Diabetes Mellitus II Father    Kidney failure Maternal Grandmother    Diabetes Maternal Grandfather    COPD Maternal Grandfather    Kidney failure Maternal Grandfather    Cancer Paternal Grandmother        asbestos   Thyroid  disease Maternal Aunt    Migraines Maternal Aunt    Alcohol abuse Neg Hx    Arthritis Neg Hx    Asthma Neg Hx    Birth defects Neg Hx    Depression Neg Hx    Drug abuse Neg Hx    Early death Neg Hx    Hearing loss Neg Hx    Heart disease Neg Hx    Hyperlipidemia Neg Hx    Hypertension Neg Hx    Kidney disease Neg Hx    Learning disabilities Neg Hx    Mental illness Neg Hx    Mental retardation Neg Hx    Miscarriages / Stillbirths Neg Hx    Stroke Neg Hx    Vision loss Neg Hx    Varicose Veins Neg Hx    Seizures Neg Hx    Autism Neg Hx    ADD / ADHD Neg Hx    Anxiety disorder  Neg Hx    Bipolar disorder Neg Hx    Schizophrenia Neg Hx     Social History   Socioeconomic History   Marital status: Single    Spouse name: Not on file   Number of children: Not on file   Years of education: Not on file   Highest education level: Not on file  Occupational History   Not on file  Tobacco Use   Smoking status: Never   Smokeless tobacco: Never  Vaping Use   Vaping status: Never Used  Substance and Sexual Activity   Alcohol use: Not Currently   Drug use: Never   Sexual activity: Never    Birth control/protection: Pill  Other Topics Concern   Not on file  Social History Narrative   12th grade at [employer]. She does well in school.    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   Has a cat  at home          Social Drivers of Health   Financial Resource Strain: Low Risk  (09/17/2018)   Overall Financial Resource Strain (CARDIA)    Difficulty of Paying Living Expenses: Not hard at all  Food Insecurity: No Food Insecurity (09/17/2018)   Hunger Vital Sign    Worried About Running Out of Food in the Last Year: Never true    Ran Out of Food in the Last Year: Never true  Transportation Needs: No Transportation Needs (09/17/2018)   PRAPARE - Administrator, Civil Service (Medical): No    Lack of Transportation (Non-Medical): No  Physical Activity: Insufficiently Active (10/10/2022)   Exercise Vital Sign    Days of Exercise per Week: 1 day    Minutes of Exercise per Session: 50 min  Stress: Stress Concern Present (09/17/2018)   [employer] of Occupational Health - Occupational Stress Questionnaire    Feeling of Stress : To some extent  Social Connections: Unknown (09/17/2018)   Social Connection and Isolation Panel    Frequency of Communication with Friends and Family: More than three times a week    Frequency of Social Gatherings with Friends and Family: More than three times a week    Attends Religious Services: More than 4 times per year    Active Member of [employer] or Organizations: Yes    Attends Banker Meetings: More than 4 times per year    Marital Status: Not on file  Intimate Partner Violence: Not At Risk (09/17/2018)   Humiliation, Afraid, Rape, and Kick questionnaire    Fear of Current or Ex-Partner: No    Emotionally Abused: No    Physically Abused: No    Sexually Abused: No    Outpatient Medications Prior to Visit  Medication Sig Dispense Refill   levocetirizine (XYZAL) 5 MG tablet Take 1 tablet by mouth daily.     norethindrone  (MICRONOR ) 0.35 MG tablet Take 1 tablet (0.35 mg total) by mouth daily. 84 tablet 3   fluticasone  (FLONASE ) 50 MCG/ACT nasal spray Place 2 sprays into both nostrils daily. 16 g 6   oseltamivir   (TAMIFLU ) 75 MG capsule Take 1 capsule (75 mg total) by mouth 2 (two) times daily. 10 capsule 0   promethazine -dextromethorphan (PROMETHAZINE -DM) 6.25-15 MG/5ML syrup Take 5 mLs by mouth 4 (four) times daily as needed. 118 mL 0   No facility-administered medications prior to visit.    Allergies  Allergen Reactions   Amoxicillin  Itching    Review of Systems  Constitutional:  Negative for weight loss.  HENT:  Negative for congestion and hearing loss.   Eyes:  Negative for blurred vision.  Respiratory:  Negative for cough.   Cardiovascular:  Negative for leg swelling.  Gastrointestinal:  Negative for constipation and diarrhea.  Genitourinary:  Negative for dysuria and frequency.       Periods normal  Musculoskeletal:  Negative for joint pain and myalgias.  Skin:  Negative for rash.  Neurological:  Positive for headaches (one migraine/week.  treats with water).  Psychiatric/Behavioral:  Negative for depression. The patient is not nervous/anxious.    Objective:    Physical Exam   BP (!) 140/100   Pulse 89   Temp 98.8 F (37.1 C) (Oral)   Resp 16   Ht 5' 6 (1.676 m)   Wt 213 lb 6.4 oz (96.8 kg)   SpO2 100%   BMI 34.44 kg/m  Wt Readings from Last 3 Encounters:  06/21/24 213 lb 6.4 oz (96.8 kg)  12/29/23 205 lb (93 kg)  08/19/23 202 lb 6.4 oz (91.8 kg)  Physical Exam  Constitutional: She is oriented to person, place, and time. She appears well-developed and well-nourished. No distress.  HENT:  Head: Normocephalic and atraumatic.  Right Ear: Tympanic membrane and ear canal normal.  Left Ear: Tympanic membrane and ear canal normal.  Mouth/Throat: Oropharynx is clear and moist.  Eyes: Pupils are equal, round, and reactive to light. No scleral icterus.  Neck: Normal range of motion. No thyromegaly present.  Cardiovascular: Normal rate and regular rhythm.   No murmur heard. Pulmonary/Chest: Effort normal and breath sounds normal. No respiratory distress. He has no wheezes.  She has no rales. She exhibits no tenderness.  Abdominal: Soft. Bowel sounds are normal. She exhibits no distension and no mass. There is no tenderness. There is no rebound and no guarding.  Musculoskeletal: She exhibits no edema.  Lymphadenopathy:    She has no cervical adenopathy.  Neurological: She is alert and oriented to person, place, and time. She has normal patellar reflexes. She exhibits normal muscle tone. Coordination normal.  Skin: Skin is warm and dry.  Psychiatric: She has a normal mood and affect. Her behavior is normal. Judgment and thought content normal.  Breast/Pelvic: deferred          Assessment & Plan:        Assessment & Plan:   Problem List Items Addressed This Visit       Unprioritized   Renal artery stenosis (HCC)   1-59% on US  2024.  With bp being elevated today, will update US .       Relevant Orders   US  RENAL ARTERY DUPLEX COMPLETE   Preventative health care - Primary   She is due for pap but GYN did not perform advising her to return after she has become sexually active.  Discussed importance of regular exercise, healthy diet.  Immunizations reviewed and up to date.       Obesity (BMI 30.0-34.9)   Relevant Orders   Lipid panel   Migraine without aura and without status migrainosus, not intractable   Stable. Recommend otc Excedrin migraine prn. If ineffective let us  know and we can try a triptan.       Elevated blood pressure reading   BP Readings from Last 3 Encounters:  06/21/24 (!) 147/88  12/29/23 (!) 130/98  08/19/23 132/88   Repeat bp in 1 week. If still  high will plan treatment.       Other Visit Diagnoses       Heat intolerance       Relevant Orders   TSH     Dizziness       Relevant Orders   CBC w/Diff   Comp Met (CMET)       I have discontinued Marialy G. Lesniak's oseltamivir , promethazine -dextromethorphan, and fluticasone . I am also having her maintain her levocetirizine and norethindrone .  No orders of the  defined types were placed in this encounter.

## 2024-06-28 ENCOUNTER — Other Ambulatory Visit (HOSPITAL_BASED_OUTPATIENT_CLINIC_OR_DEPARTMENT_OTHER): Payer: Self-pay

## 2024-06-28 ENCOUNTER — Ambulatory Visit (INDEPENDENT_AMBULATORY_CARE_PROVIDER_SITE_OTHER)

## 2024-06-28 DIAGNOSIS — I10 Essential (primary) hypertension: Secondary | ICD-10-CM

## 2024-06-28 MED ORDER — AMLODIPINE BESYLATE 5 MG PO TABS
5.0000 mg | ORAL_TABLET | Freq: Every day | ORAL | 2 refills | Status: DC
  Filled 2024-06-28: qty 90, 90d supply, fill #0

## 2024-06-28 NOTE — Progress Notes (Signed)
 Pt here for Blood pressure check per Eleanor Ponto, FNP  Pt currently takes: nothing   Pt reports compliance with medication.  BP today @ =138/100 ( left arm) 136/90( right arm)  5 minute wait  126/90 ( left arm) 136/84( right)  HR =70  Pt advised per PCP to start Norvasc  5 mg daily , and recheck BP in 2 weeks

## 2024-06-30 NOTE — Addendum Note (Signed)
 Addended by: DARYL SETTER on: 06/30/2024 09:22 AM   Modules accepted: Orders

## 2024-07-12 ENCOUNTER — Other Ambulatory Visit (HOSPITAL_BASED_OUTPATIENT_CLINIC_OR_DEPARTMENT_OTHER): Payer: Self-pay

## 2024-07-12 ENCOUNTER — Ambulatory Visit (INDEPENDENT_AMBULATORY_CARE_PROVIDER_SITE_OTHER)

## 2024-07-12 ENCOUNTER — Encounter: Payer: Self-pay | Admitting: Family

## 2024-07-12 DIAGNOSIS — R030 Elevated blood-pressure reading, without diagnosis of hypertension: Secondary | ICD-10-CM

## 2024-07-12 DIAGNOSIS — I10 Essential (primary) hypertension: Secondary | ICD-10-CM

## 2024-07-12 MED ORDER — METOPROLOL SUCCINATE ER 50 MG PO TB24
50.0000 mg | ORAL_TABLET | Freq: Every day | ORAL | 0 refills | Status: DC
  Filled 2024-07-12: qty 30, 30d supply, fill #0

## 2024-07-12 NOTE — Progress Notes (Signed)
 Pt here for Blood pressure check per Eleanor Ponto , FNP  Pt currently takes: Norvasc  5 mg   Pt reports compliance with medication.  BP today @ = 122/90 HR =102  Pt advised per take 1.5 tablets daily and recheck BP in one week

## 2024-07-13 ENCOUNTER — Other Ambulatory Visit (HOSPITAL_BASED_OUTPATIENT_CLINIC_OR_DEPARTMENT_OTHER): Payer: Self-pay

## 2024-07-13 ENCOUNTER — Ambulatory Visit

## 2024-07-22 ENCOUNTER — Ambulatory Visit: Admitting: *Deleted

## 2024-07-22 ENCOUNTER — Other Ambulatory Visit (HOSPITAL_BASED_OUTPATIENT_CLINIC_OR_DEPARTMENT_OTHER): Payer: Self-pay

## 2024-07-22 DIAGNOSIS — I10 Essential (primary) hypertension: Secondary | ICD-10-CM | POA: Diagnosis not present

## 2024-07-22 MED ORDER — HYDROCHLOROTHIAZIDE 25 MG PO TABS
25.0000 mg | ORAL_TABLET | Freq: Every day | ORAL | 0 refills | Status: DC
  Filled 2024-07-22: qty 30, 30d supply, fill #0

## 2024-07-22 NOTE — Progress Notes (Signed)
 Pt here for Blood pressure check per 07/12/24 order of Eleanor Ponto, NP  Pt currently takes: Toprol  XL 50mg  once a day.  Pt reports compliance with medication. Has noticed more headaches since starting Toprol  than before. Also notes dizziness 1 1/2 hr after taking medication so switched to taking it at night. Also notes heart rate at night ranges between 40s-60s.  BP today, left arm, manual @ 8:47am = 120/90 HR = 60 Recheck, right arm, manual = 130/80 @ 8:55am  BP Readings from Last 3 Encounters:  06/21/24 (!) 140/100  12/29/23 (!) 130/98  08/19/23 132/88    Pt advised per Eleanor Ponto, NP.  Stop Toprol  XL and start hydrochlorothiazide  25mg  once daily. 30 day supply sent to pharmacy. Repeat BP and bmp in 1 week.

## 2024-07-24 ENCOUNTER — Other Ambulatory Visit: Payer: Self-pay | Admitting: Family

## 2024-07-24 DIAGNOSIS — Z0000 Encounter for general adult medical examination without abnormal findings: Secondary | ICD-10-CM

## 2024-07-24 DIAGNOSIS — E66811 Obesity, class 1: Secondary | ICD-10-CM

## 2024-07-24 DIAGNOSIS — I10 Essential (primary) hypertension: Secondary | ICD-10-CM

## 2024-07-24 DIAGNOSIS — G43009 Migraine without aura, not intractable, without status migrainosus: Secondary | ICD-10-CM

## 2024-07-24 DIAGNOSIS — I701 Atherosclerosis of renal artery: Secondary | ICD-10-CM

## 2024-07-24 DIAGNOSIS — R42 Dizziness and giddiness: Secondary | ICD-10-CM

## 2024-07-24 DIAGNOSIS — R6889 Other general symptoms and signs: Secondary | ICD-10-CM

## 2024-07-24 DIAGNOSIS — R030 Elevated blood-pressure reading, without diagnosis of hypertension: Secondary | ICD-10-CM

## 2024-07-28 ENCOUNTER — Encounter: Payer: Self-pay | Admitting: Internal Medicine

## 2024-07-29 ENCOUNTER — Ambulatory Visit

## 2024-07-29 ENCOUNTER — Other Ambulatory Visit

## 2024-07-29 ENCOUNTER — Other Ambulatory Visit (INDEPENDENT_AMBULATORY_CARE_PROVIDER_SITE_OTHER)

## 2024-07-29 VITALS — BP 108/71 | HR 97

## 2024-07-29 DIAGNOSIS — I10 Essential (primary) hypertension: Secondary | ICD-10-CM | POA: Diagnosis not present

## 2024-07-29 LAB — BASIC METABOLIC PANEL WITH GFR
BUN: 14 mg/dL (ref 6–23)
CO2: 26 meq/L (ref 19–32)
Calcium: 9.2 mg/dL (ref 8.4–10.5)
Chloride: 101 meq/L (ref 96–112)
Creatinine, Ser: 0.58 mg/dL (ref 0.40–1.20)
GFR: 127.54 mL/min (ref >60.00)
Glucose, Bld: 100 mg/dL — ABNORMAL HIGH (ref 70–99)
Potassium: 3.8 meq/L (ref 3.5–5.1)
Sodium: 138 meq/L (ref 135–145)

## 2024-07-29 NOTE — Progress Notes (Signed)
 Pt here for Blood pressure check per Melissa  Pt currently takes: hydrochlorothiazide  (HYDRODIURIL ) 25 MG tablet   Pt has not taken her medications this morning, normally takes around this time.    Pt reports compliance with medication.  BP today @ = 108/71 HR = 97   Pt advised per Melissa- continue current meds, follow-up in 3 months.

## 2024-08-01 ENCOUNTER — Other Ambulatory Visit

## 2024-08-01 ENCOUNTER — Ambulatory Visit: Payer: Self-pay | Admitting: Family

## 2024-08-01 ENCOUNTER — Ambulatory Visit

## 2024-08-02 ENCOUNTER — Encounter (HOSPITAL_BASED_OUTPATIENT_CLINIC_OR_DEPARTMENT_OTHER)

## 2024-08-02 ENCOUNTER — Other Ambulatory Visit (HOSPITAL_BASED_OUTPATIENT_CLINIC_OR_DEPARTMENT_OTHER): Payer: Commercial Managed Care - PPO

## 2024-08-08 ENCOUNTER — Ambulatory Visit (HOSPITAL_BASED_OUTPATIENT_CLINIC_OR_DEPARTMENT_OTHER)
Admission: RE | Admit: 2024-08-08 | Discharge: 2024-08-08 | Disposition: A | Discharge status: Home / Self Care | Source: Ambulatory Visit | Attending: Family | Admitting: Family

## 2024-08-08 ENCOUNTER — Ambulatory Visit (HOSPITAL_BASED_OUTPATIENT_CLINIC_OR_DEPARTMENT_OTHER)
Admission: RE | Admit: 2024-08-08 | Discharge: 2024-08-08 | Disposition: A | Discharge status: Home / Self Care | Source: Ambulatory Visit | Attending: Physician Assistant

## 2024-08-08 DIAGNOSIS — Q2381 Bicuspid aortic valve: Secondary | ICD-10-CM | POA: Insufficient documentation

## 2024-08-08 DIAGNOSIS — I10 Essential (primary) hypertension: Secondary | ICD-10-CM | POA: Insufficient documentation

## 2024-08-08 DIAGNOSIS — I517 Cardiomegaly: Secondary | ICD-10-CM

## 2024-08-08 DIAGNOSIS — I701 Atherosclerosis of renal artery: Secondary | ICD-10-CM | POA: Diagnosis not present

## 2024-08-08 LAB — ECHOCARDIOGRAM COMPLETE
AR max vel: 1.51 cm2
AV Area VTI: 1.7 cm2
AV Area mean vel: 1.56 cm2
AV Mean grad: 7 mmHg
AV Peak grad: 12.7 mmHg
AV Vena cont: 0.2 cm
Ao pk vel: 1.78 m/s
Area-P 1/2: 3.6 cm2
Calc EF: 65 %
S' Lateral: 2.7 cm
Single Plane A2C EF: 66.7 %
Single Plane A4C EF: 63.5 %

## 2024-08-09 ENCOUNTER — Ambulatory Visit: Payer: Self-pay | Admitting: Family

## 2024-08-09 ENCOUNTER — Ambulatory Visit: Payer: Self-pay | Admitting: Physician Assistant

## 2024-08-22 ENCOUNTER — Other Ambulatory Visit (HOSPITAL_BASED_OUTPATIENT_CLINIC_OR_DEPARTMENT_OTHER): Payer: Self-pay

## 2024-08-22 ENCOUNTER — Other Ambulatory Visit: Payer: Self-pay | Admitting: Family

## 2024-08-22 DIAGNOSIS — I10 Essential (primary) hypertension: Secondary | ICD-10-CM

## 2024-08-22 MED ORDER — HYDROCHLOROTHIAZIDE 25 MG PO TABS
25.0000 mg | ORAL_TABLET | Freq: Every day | ORAL | 0 refills | Status: DC
  Filled 2024-08-22: qty 90, 90d supply, fill #0

## 2024-08-22 NOTE — Telephone Encounter (Signed)
 Please advise pt that I would like to check her potassium on the hydrochlorothiazide .  Order placed.

## 2024-08-24 ENCOUNTER — Other Ambulatory Visit (INDEPENDENT_AMBULATORY_CARE_PROVIDER_SITE_OTHER)

## 2024-08-24 DIAGNOSIS — I10 Essential (primary) hypertension: Secondary | ICD-10-CM

## 2024-08-24 NOTE — Telephone Encounter (Signed)
 Patient notified and scheduled to have labs today

## 2024-08-25 ENCOUNTER — Ambulatory Visit: Payer: Self-pay | Admitting: Family

## 2024-08-25 LAB — BASIC METABOLIC PANEL WITH GFR
BUN: 12 mg/dL (ref 6–23)
CO2: 25 meq/L (ref 19–32)
Calcium: 9.6 mg/dL (ref 8.4–10.5)
Chloride: 103 meq/L (ref 96–112)
Creatinine, Ser: 0.57 mg/dL (ref 0.40–1.20)
GFR: 128.01 mL/min (ref >60.00)
Glucose, Bld: 88 mg/dL (ref 70–99)
Potassium: 3.7 meq/L (ref 3.5–5.1)
Sodium: 138 meq/L (ref 135–145)

## 2024-08-29 ENCOUNTER — Encounter: Payer: Self-pay | Admitting: Family Medicine

## 2024-08-29 ENCOUNTER — Ambulatory Visit: Admitting: Family Medicine

## 2024-08-29 VITALS — BP 112/80 | HR 95 | Temp 98.9°F | Resp 18 | Ht 66.0 in | Wt 210.0 lb

## 2024-08-29 DIAGNOSIS — I10 Essential (primary) hypertension: Secondary | ICD-10-CM | POA: Diagnosis not present

## 2024-08-29 DIAGNOSIS — E162 Hypoglycemia, unspecified: Secondary | ICD-10-CM | POA: Diagnosis not present

## 2024-08-29 DIAGNOSIS — E66811 Obesity, class 1: Secondary | ICD-10-CM | POA: Diagnosis not present

## 2024-08-29 LAB — CBC WITH DIFFERENTIAL/PLATELET
Basophils Absolute: 0 K/uL (ref 0.0–0.1)
Basophils Relative: 0.4 % (ref 0.0–3.0)
Eosinophils Absolute: 0.1 K/uL (ref 0.0–0.7)
Eosinophils Relative: 1.5 % (ref 0.0–5.0)
HCT: 44.7 % (ref 36.0–46.0)
Hemoglobin: 15.2 g/dL — ABNORMAL HIGH (ref 12.0–15.0)
Lymphocytes Relative: 44.5 % (ref 12.0–46.0)
Lymphs Abs: 2.6 K/uL (ref 0.7–4.0)
MCHC: 34 g/dL (ref 30.0–36.0)
MCV: 86.3 fl (ref 78.0–100.0)
Monocytes Absolute: 0.3 K/uL (ref 0.1–1.0)
Monocytes Relative: 4.4 % (ref 3.0–12.0)
Neutro Abs: 2.8 K/uL (ref 1.4–7.7)
Neutrophils Relative %: 49.2 % (ref 43.0–77.0)
Platelets: 252 K/uL (ref 150.0–400.0)
RBC: 5.17 Mil/uL — ABNORMAL HIGH (ref 3.87–5.11)
RDW: 13.1 % (ref 11.5–15.5)
WBC: 5.8 K/uL (ref 4.0–10.5)

## 2024-08-29 LAB — TSH: TSH: 1.14 u[IU]/mL (ref 0.35–5.50)

## 2024-08-29 LAB — HEMOGLOBIN A1C: Hgb A1c MFr Bld: 5.3 % (ref 4.6–6.5)

## 2024-08-29 LAB — GLUCOSE, POCT (MANUAL RESULT ENTRY): POC Glucose: 98 mg/dL (ref 70–99)

## 2024-08-29 NOTE — Assessment & Plan Note (Signed)
 Well controlled, no changes to meds. Encouraged heart healthy diet such as the DASH diet and exercise as tolerated.

## 2024-08-29 NOTE — Progress Notes (Signed)
 Subjective:    Patient ID: Elaine Wood, female    DOB: 04/23/2001, 22 y.o.   MRN: [id number]  Chief Complaint  Patient presents with   Hypoglycemia    HPI Patient is in today for low blood sugar.  Discussed the use of AI scribe software for clinical note transcription with the patient, who gave verbal consent to proceed.  History of Present Illness Elaine Wood is a 23 year old female who presents with concerns of low blood sugar readings and associated symptoms.  She has been monitoring her blood sugar levels using an arm monitor as part of her weight loss efforts. Her blood sugar levels remain in the fifties, even after eating, and drop to around fifty while she is asleep. She experiences hot flashes, sweating, nausea, and dizziness when her blood sugar drops, which improves after eating.  She recalls a specific instance over the weekend when she felt very sick and checked her blood sugar with a finger stick, which read twenty-seven, while the monitor showed fifty with a downward arrow. She also experienced a near-fainting episode after having coffee in the morning.  Her typical morning routine includes having a cup of coffee and then eating breakfast around 8:30 AM. Breakfast usually consists of mini pancakes and a fruit cup. Her symptoms seem to correlate with the drops in her blood sugar levels.  There is a family history of diabetes, with both parents having the condition, which raises her concern about her own blood sugar levels. She also reports experiencing hot flashes for several months, for which she was prescribed blood pressure medication, although her blood pressure was not consistently high.    Past Medical History:  Diagnosis Date   Allergy    Bicuspid aortic valve determined by imaging 07/18/2019   BMI (body mass index), pediatric, 85% to less than 95% for age 24/11/2014   Fatigue 08/28/2017   Migraine without aura and without status migrainosus, not intractable  03/03/2018   Overweight 10/12/2013   Shortness of breath 06/20/2019   Stabbing chest pain 06/20/2019   Tension headache 03/24/2018    Past Surgical History:  Procedure Laterality Date   NO PAST SURGERIES      Family History  Problem Relation Age of Onset   Diabetes Mother        pre diabetic   Thyroid  disease Mother        hashimotos - synthroid   Diabetes Mellitus II Father    Kidney failure Maternal Grandmother    Diabetes Maternal Grandfather    COPD Maternal Grandfather    Kidney failure Maternal Grandfather    Cancer Paternal Grandmother        asbestos   Thyroid  disease Maternal Aunt    Migraines Maternal Aunt    Alcohol abuse Neg Hx    Arthritis Neg Hx    Asthma Neg Hx    Birth defects Neg Hx    Depression Neg Hx    Drug abuse Neg Hx    Early death Neg Hx    Hearing loss Neg Hx    Heart disease Neg Hx    Hyperlipidemia Neg Hx    Hypertension Neg Hx    Kidney disease Neg Hx    Learning disabilities Neg Hx    Mental illness Neg Hx    Mental retardation Neg Hx    Miscarriages / Stillbirths Neg Hx    Stroke Neg Hx    Vision loss Neg Hx    Varicose Veins  Neg Hx    Seizures Neg Hx    Autism Neg Hx    ADD / ADHD Neg Hx    Anxiety disorder Neg Hx    Bipolar disorder Neg Hx    Schizophrenia Neg Hx     Social History   Socioeconomic History   Marital status: Single    Spouse name: Not on file   Number of children: Not on file   Years of education: Not on file   Highest education level: Not on file  Occupational History   Not on file  Tobacco Use   Smoking status: Never   Smokeless tobacco: Never  Vaping Use   Vaping status: Never Used  Substance and Sexual Activity   Alcohol use: Not Currently   Drug use: Never   Sexual activity: Never    Birth control/protection: Pill  Other Topics Concern   Not on file  Social History Narrative   12th grade at [employer]. She does well in school.    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   Has a cat at home          Social Drivers of Health   Financial Resource Strain: Low Risk  (09/17/2018)   Overall Financial Resource Strain (CARDIA)    Difficulty of Paying Living Expenses: Not hard at all  Food Insecurity: No Food Insecurity (09/17/2018)   Hunger Vital Sign    Worried About Running Out of Food in the Last Year: Never true    Ran Out of Food in the Last Year: Never true  Transportation Needs: No Transportation Needs (09/17/2018)   PRAPARE - Administrator, Civil Service (Medical): No    Lack of Transportation (Non-Medical): No  Physical Activity: Insufficiently Active (10/10/2022)   Exercise Vital Sign    Days of Exercise per Week: 1 day    Minutes of Exercise per Session: 50 min  Stress: Stress Concern Present (09/17/2018)   [employer] of Occupational Health - Occupational Stress Questionnaire    Feeling of Stress : To some extent  Social Connections: Unknown (09/17/2018)   Social Connection and Isolation Panel    Frequency of Communication with Friends and Family: More than three times a week    Frequency of Social Gatherings with Friends and Family: More than three times a week    Attends Religious Services: More than 4 times per year    Active Member of [employer] or Organizations: Yes    Attends Banker Meetings: More than 4 times per year    Marital Status: Not on file  Intimate Partner Violence: Not At Risk (09/17/2018)   Humiliation, Afraid, Rape, and Kick questionnaire    Fear of Current or Ex-Partner: No    Emotionally Abused: No    Physically Abused: No    Sexually Abused: No    Outpatient Medications Prior to Visit  Medication Sig Dispense Refill   hydrochlorothiazide  (HYDRODIURIL ) 25 MG tablet Take 1 tablet (25 mg total) by mouth daily. 90 tablet 0   levocetirizine (XYZAL) 5 MG tablet Take 1  tablet by mouth daily.     norethindrone  (MICRONOR ) 0.35 MG tablet Take 1 tablet (0.35 mg total) by mouth daily. 84 tablet 3   No facility-administered medications prior to visit.    Allergies  Allergen Reactions   Amlodipine      Skin redness. Swelling in feet  Amoxicillin  Itching    Review of Systems  Constitutional:  Negative for fever and malaise/fatigue.  HENT:  Negative for congestion.   Eyes:  Negative for blurred vision.  Respiratory:  Negative for shortness of breath.   Cardiovascular:  Negative for chest pain, palpitations and leg swelling.  Gastrointestinal:  Negative for abdominal pain, blood in stool and nausea.  Genitourinary:  Negative for dysuria and frequency.  Musculoskeletal:  Negative for falls.  Skin:  Negative for rash.  Neurological:  Negative for dizziness, loss of consciousness and headaches.  Endo/Heme/Allergies:  Negative for environmental allergies.  Psychiatric/Behavioral:  Negative for depression. The patient is not nervous/anxious.        Objective:    Physical Exam Vitals and nursing note reviewed.  Constitutional:      General: She is not in acute distress.    Appearance: Normal appearance. She is well-developed.  HENT:     Head: Normocephalic and atraumatic.  Cardiovascular:     Rate and Rhythm: Normal rate and regular rhythm.     Heart sounds: No murmur heard. Pulmonary:     Effort: Pulmonary effort is normal. No respiratory distress.     Breath sounds: Normal breath sounds.  Musculoskeletal:     Cervical back: Normal range of motion and neck supple.  Skin:    General: Skin is warm and dry.  Neurological:     General: No focal deficit present.     Mental Status: She is alert and oriented to person, place, and time.  Psychiatric:        Mood and Affect: Mood normal.        Behavior: Behavior normal.        Thought Content: Thought content normal.        Judgment: Judgment normal.     BP 112/80 (BP Location: Left Arm, Patient  Position: Sitting, Cuff Size: Large)   Pulse 95   Temp 98.9 F (37.2 C) (Oral)   Resp 18   Ht 5' 6 (1.676 m)   Wt 210 lb (95.3 kg)   SpO2 98%   BMI 33.89 kg/m  Wt Readings from Last 3 Encounters:  08/29/24 210 lb (95.3 kg)  06/21/24 213 lb 6.4 oz (96.8 kg)  12/29/23 205 lb (93 kg)    Diabetic Foot Exam - Simple   No data filed    Lab Results  Component Value Date   WBC 7.5 06/21/2024   HGB 14.6 06/21/2024   HCT 43.3 06/21/2024   PLT 239.0 06/21/2024   GLUCOSE 88 08/24/2024   CHOL 196 06/21/2024   TRIG 114.0 06/21/2024   HDL 48.70 06/21/2024   LDLCALC 124 (H) 06/21/2024   ALT 30 06/21/2024   AST 18 06/21/2024   NA 138 08/24/2024   K 3.7 08/24/2024   CL 103 08/24/2024   CREATININE 0.57 08/24/2024   BUN 12 08/24/2024   CO2 25 08/24/2024   TSH 0.98 06/21/2024   HGBA1C 5.0 11/21/2020    Lab Results  Component Value Date   TSH 0.98 06/21/2024   Lab Results  Component Value Date   WBC 7.5 06/21/2024   HGB 14.6 06/21/2024   HCT 43.3 06/21/2024   MCV 87.0 06/21/2024   PLT 239.0 06/21/2024   Lab Results  Component Value Date   NA 138 08/24/2024   K 3.7 08/24/2024   CO2 25 08/24/2024   GLUCOSE 88 08/24/2024   BUN 12 08/24/2024   CREATININE 0.57 08/24/2024   BILITOT 0.5 06/21/2024   ALKPHOS  61 06/21/2024   AST 18 06/21/2024   ALT 30 06/21/2024   PROT 7.7 06/21/2024   ALBUMIN 5.0 06/21/2024   CALCIUM 9.6 08/24/2024   ANIONGAP 9 07/22/2019   GFR 128.01 08/24/2024   Lab Results  Component Value Date   CHOL 196 06/21/2024   Lab Results  Component Value Date   HDL 48.70 06/21/2024   Lab Results  Component Value Date   LDLCALC 124 (H) 06/21/2024   Lab Results  Component Value Date   TRIG 114.0 06/21/2024   Lab Results  Component Value Date   CHOLHDL 4 06/21/2024   Lab Results  Component Value Date   HGBA1C 5.0 11/21/2020       Assessment & Plan:  Low blood sugar Assessment & Plan: Cgm discrepancy with finger stick Eat 5 x a day ---  every 2-3 hours , high protein Check labs   Orders: -     POCT glucose (manual entry) -     Comprehensive metabolic panel with GFR -     TSH -     Hemoglobin A1c  Primary hypertension Assessment & Plan: Well controlled, no changes to meds. Encouraged heart healthy diet such as the DASH diet and exercise as tolerated.    Orders: -     CBC with Differential/Platelet -     Comprehensive metabolic panel with GFR -     Lipid panel -     TSH -     Hemoglobin A1c  Obesity (BMI 30.0-34.9)   Assessment and Plan Assessment & Plan Hypoglycemia   She experiences hypoglycemic episodes with blood glucose levels as low as 27 mg/dL, confirmed by fingerstick. Symptoms include hot flashes, sweating, nausea, dizziness, and weakness, which improve with food intake. A family history of diabetes suggests hypoglycemia may be a precursor to diabetes. Order blood work, including A1c, to assess glucose control and potential diabetes risk. Advise eating small meals every 2-3 hours or three regular meals with high-protein snacks to maintain stable blood glucose levels. Recommend high-protein snacks such as almonds, apple with peanut butter, Greek yogurt, or protein shakes like Fairlife or Premier. Advise against relying solely on carbohydrates to prevent rapid blood glucose fluctuations. Instruct to use fingerstick glucose monitoring at home if feeling unwell. Advise removing the over-the-counter continuous glucose monitor if it causes anxiety or inaccurate readings.   Mallorey Odonell R Lowne Chase, DO

## 2024-08-29 NOTE — Assessment & Plan Note (Signed)
 Cgm discrepancy with finger stick Eat 5 x a day --- every 2-3 hours , high protein Check labs

## 2024-08-30 ENCOUNTER — Encounter: Payer: Self-pay | Admitting: Family Medicine

## 2024-08-30 ENCOUNTER — Other Ambulatory Visit: Payer: Self-pay | Admitting: Family Medicine

## 2024-08-30 DIAGNOSIS — Z833 Family history of diabetes mellitus: Secondary | ICD-10-CM

## 2024-08-30 DIAGNOSIS — E162 Hypoglycemia, unspecified: Secondary | ICD-10-CM

## 2024-08-30 LAB — COMPREHENSIVE METABOLIC PANEL WITH GFR
ALT: 36 U/L — ABNORMAL HIGH (ref 0–35)
AST: 20 U/L (ref 0–37)
Albumin: 5 g/dL (ref 3.5–5.2)
Alkaline Phosphatase: 63 U/L (ref 39–117)
BUN: 13 mg/dL (ref 6–23)
CO2: 25 meq/L (ref 19–32)
Calcium: 9.6 mg/dL (ref 8.4–10.5)
Chloride: 103 meq/L (ref 96–112)
Creatinine, Ser: 0.54 mg/dL (ref 0.40–1.20)
GFR: 129.67 mL/min (ref >60.00)
Glucose, Bld: 82 mg/dL (ref 70–99)
Potassium: 3.7 meq/L (ref 3.5–5.1)
Sodium: 140 meq/L (ref 135–145)
Total Bilirubin: 0.3 mg/dL (ref 0.2–1.2)
Total Protein: 7.5 g/dL (ref 6.0–8.3)

## 2024-08-30 LAB — LIPID PANEL
Cholesterol: 179 mg/dL (ref 0–200)
HDL: 42.7 mg/dL (ref >39.00)
LDL Cholesterol: 115 mg/dL — ABNORMAL HIGH (ref 0–99)
NonHDL: 136.42
Total CHOL/HDL Ratio: 4
Triglycerides: 108 mg/dL (ref 0.0–149.0)
VLDL: 21.6 mg/dL (ref 0.0–40.0)

## 2024-08-31 NOTE — Telephone Encounter (Signed)
 Pt made aware.

## 2024-09-04 ENCOUNTER — Ambulatory Visit: Payer: Self-pay | Admitting: Family Medicine

## 2024-10-28 ENCOUNTER — Ambulatory Visit: Admitting: Family

## 2024-10-28 VITALS — BP 136/76 | HR 86 | Temp 99.1°F | Ht 66.0 in | Wt 216.2 lb

## 2024-10-28 DIAGNOSIS — I10 Essential (primary) hypertension: Secondary | ICD-10-CM | POA: Diagnosis not present

## 2024-10-28 DIAGNOSIS — E66811 Obesity, class 1: Secondary | ICD-10-CM | POA: Diagnosis not present

## 2024-10-28 NOTE — Assessment & Plan Note (Addendum)
 Blood pressure at 136/76 mmHg. Hydrochlorothiazide  discontinued due to side effects. Current level acceptable without medication and OK to remain off of hydrochlorothiazide .  - Monitor blood pressure weekly at home. - Report if consistently over 140/90 mmHg. - Adopt low sodium diet, exercise, pursue weight loss. - follow up in 3 months.

## 2024-10-28 NOTE — Assessment & Plan Note (Signed)
  Weight loss recommended to aid blood pressure management. Patient open to lifestyle changes. - Encouraged weight loss through diet and exercise.

## 2024-10-28 NOTE — Progress Notes (Signed)
 Subjective:     Patient ID: Elaine Wood, female    DOB: 04-Sep-2001, 23 y.o.   MRN: [id number]  Chief Complaint  Patient presents with   Follow-up    Blood pressure - 3 month follow up    HPI  Discussed the use of AI scribe software for clinical note transcription with the patient, who gave verbal consent to proceed.  History of Present Illness Elaine Wood is a 23 year old female with hypertension who presents with issues related to her blood pressure medication.  She has difficulty adhering to her prescribed hydrochlorothiazide  25 mg due to forgetfulness and adverse effects. Taking the medication late in the day disrupts her sleep. After resuming the medication for three weeks, she experienced significant joint pain, leg cramps, and severe indigestion, described as feeling like a heart attack. These symptoms resolved after discontinuing the medication, although some joint pain and leg cramps persist.  She has been consuming bananas to help with the leg cramps, suspecting a link to potassium levels, as she missed eating bananas on days when cramps occurred.  She denies any current concerns apart from the issues related to her blood pressure medication and a spot on her scalp, which she initially thought was a sunspot but was reassured it appears to be a pimple.      Health Maintenance Due  Topic Date Due   Influenza Vaccine  06/24/2024    Past Medical History:  Diagnosis Date   Allergy    Bicuspid aortic valve determined by imaging 07/18/2019   BMI (body mass index), pediatric, 85% to less than 95% for age 83/11/2014   Fatigue 08/28/2017   Migraine without aura and without status migrainosus, not intractable 03/03/2018   Overweight 10/12/2013   Shortness of breath 06/20/2019   Stabbing chest pain 06/20/2019   Tension headache 03/24/2018    Past Surgical History:  Procedure Laterality Date   NO PAST SURGERIES      Family History  Problem Relation Age of Onset    Diabetes Mother        pre diabetic   Thyroid  disease Mother        hashimotos - synthroid   Diabetes Mellitus II Father    Kidney failure Maternal Grandmother    Diabetes Maternal Grandfather    COPD Maternal Grandfather    Kidney failure Maternal Grandfather    Cancer Paternal Grandmother        asbestos   Thyroid  disease Maternal Aunt    Migraines Maternal Aunt    Alcohol abuse Neg Hx    Arthritis Neg Hx    Asthma Neg Hx    Birth defects Neg Hx    Depression Neg Hx    Drug abuse Neg Hx    Early death Neg Hx    Hearing loss Neg Hx    Heart disease Neg Hx    Hyperlipidemia Neg Hx    Hypertension Neg Hx    Kidney disease Neg Hx    Learning disabilities Neg Hx    Mental illness Neg Hx    Mental retardation Neg Hx    Miscarriages / Stillbirths Neg Hx    Stroke Neg Hx    Vision loss Neg Hx    Varicose Veins Neg Hx    Seizures Neg Hx    Autism Neg Hx    ADD / ADHD Neg Hx    Anxiety disorder Neg Hx    Bipolar disorder Neg Hx    Schizophrenia Neg  Hx     Social History   Socioeconomic History   Marital status: Single    Spouse name: Not on file   Number of children: Not on file   Years of education: Not on file   Highest education level: Not on file  Occupational History   Not on file  Tobacco Use   Smoking status: Never   Smokeless tobacco: Never  Vaping Use   Vaping status: Never Used  Substance and Sexual Activity   Alcohol use: Not Currently   Drug use: Never   Sexual activity: Never    Birth control/protection: Pill  Other Topics Concern   Not on file  Social History Narrative   12th grade at [employer]. She does well in school.    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   Has a cat at home          Social Drivers of Health   Financial Resource Strain: Low Risk  (09/17/2018)   Overall Financial Resource  Strain (CARDIA)    Difficulty of Paying Living Expenses: Not hard at all  Food Insecurity: No Food Insecurity (09/17/2018)   Hunger Vital Sign    Worried About Running Out of Food in the Last Year: Never true    Ran Out of Food in the Last Year: Never true  Transportation Needs: No Transportation Needs (09/17/2018)   PRAPARE - Administrator, Civil Service (Medical): No    Lack of Transportation (Non-Medical): No  Physical Activity: Insufficiently Active (10/10/2022)   Exercise Vital Sign    Days of Exercise per Week: 1 day    Minutes of Exercise per Session: 50 min  Stress: Stress Concern Present (09/17/2018)   [employer] of Occupational Health - Occupational Stress Questionnaire    Feeling of Stress : To some extent  Social Connections: Unknown (09/17/2018)   Social Connection and Isolation Panel    Frequency of Communication with Friends and Family: More than three times a week    Frequency of Social Gatherings with Friends and Family: More than three times a week    Attends Religious Services: More than 4 times per year    Active Member of [employer] or Organizations: Yes    Attends Banker Meetings: More than 4 times per year    Marital Status: Not on file  Intimate Partner Violence: Not At Risk (09/17/2018)   Humiliation, Afraid, Rape, and Kick questionnaire    Fear of Current or Ex-Partner: No    Emotionally Abused: No    Physically Abused: No    Sexually Abused: No    Outpatient Medications Prior to Visit  Medication Sig Dispense Refill   levocetirizine (XYZAL) 5 MG tablet Take 1 tablet by mouth daily.     norethindrone  (MICRONOR ) 0.35 MG tablet Take 1 tablet (0.35 mg total) by mouth daily. 84 tablet 3   hydrochlorothiazide  (HYDRODIURIL ) 25 MG tablet Take 1 tablet (25 mg total) by mouth daily. (Patient not taking: Reported on 10/28/2024) 90 tablet 0   No facility-administered medications prior to visit.    Allergies  Allergen Reactions    Amlodipine      Skin redness. Swelling in feet   Hydrochlorothiazide      Muscle cramping, joint pain   Amoxicillin  Itching    ROS    See HPI Objective:    Physical Exam Constitutional:  General: She is not in acute distress.    Appearance: Normal appearance. She is well-developed.  HENT:     Head: Normocephalic and atraumatic.     Right Ear: External ear normal.     Left Ear: External ear normal.  Eyes:     General: No scleral icterus. Neck:     Thyroid : No thyromegaly.  Cardiovascular:     Rate and Rhythm: Normal rate and regular rhythm.     Heart sounds: Normal heart sounds. No murmur heard. Pulmonary:     Effort: Pulmonary effort is normal. No respiratory distress.     Breath sounds: Normal breath sounds. No wheezing.  Musculoskeletal:        General: No swelling.     Cervical back: Neck supple.  Skin:    General: Skin is warm and dry.     Comments: Benign appearing flat light brown lesion approximately 4 mm wide right temple near hairline  Neurological:     Mental Status: She is alert and oriented to person, place, and time.  Psychiatric:        Mood and Affect: Mood normal.        Behavior: Behavior normal.        Thought Content: Thought content normal.        Judgment: Judgment normal.      BP 136/76 (BP Location: Right Arm, Patient Position: Sitting, Cuff Size: Large)   Pulse 86   Temp 99.1 F (37.3 C) (Oral)   Ht 5' 6 (1.676 m)   Wt 216 lb 3.2 oz (98.1 kg)   LMP 10/14/2024 (Approximate)   SpO2 100%   BMI 34.90 kg/m  Wt Readings from Last 3 Encounters:  10/28/24 216 lb 3.2 oz (98.1 kg)  08/29/24 210 lb (95.3 kg)  06/21/24 213 lb 6.4 oz (96.8 kg)       Assessment & Plan:   Problem List Items Addressed This Visit       Unprioritized   Primary hypertension - Primary   Blood pressure at 136/76 mmHg. Hydrochlorothiazide  discontinued due to side effects. Current level acceptable without medication and OK to remain off of  hydrochlorothiazide .  - Monitor blood pressure weekly at home. - Report if consistently over 140/90 mmHg. - Adopt low sodium diet, exercise, pursue weight loss. - follow up in 3 months.      Obesity (BMI 30.0-34.9)    Weight loss recommended to aid blood pressure management. Patient open to lifestyle changes. - Encouraged weight loss through diet and exercise.      Assessment & Plan     I have discontinued Dallis G. Zatarain's hydrochlorothiazide . I am also having her maintain her levocetirizine and norethindrone .  No orders of the defined types were placed in this encounter.

## 2024-10-28 NOTE — Patient Instructions (Signed)
  VISIT SUMMARY: During your visit, we discussed your blood pressure medication and the side effects you experienced. We also reviewed a spot on your scalp and talked about general health maintenance.  YOUR PLAN: -ELEVATED BLOOD PRESSURE: Your blood pressure was 136/76 mmHg today. We have discontinued your hydrochlorothiazide  due to the side effects you experienced. Please monitor your blood pressure at home weekly and report if it consistently goes over 140/90 mmHg. Adopting a low sodium diet, regular exercise, and pursuing weight loss can help manage your blood pressure.  -OBESITY: Your BMI is in the range of 30.0-34.9, which is classified as obesity. Losing weight can help manage your blood pressure. We encourage you to adopt a healthy diet and regular exercise to aid in weight loss.  -GENERAL HEALTH MAINTENANCE: The spot on your scalp appears to be a pimple and is not concerning. Given your family history of skin issues, it is advisable to use sunscreen and a daily moisturizer with SPF to protect your skin.  INSTRUCTIONS: Please monitor your blood pressure weekly at home and report if it consistently goes over 140/90 mmHg. Follow a low sodium diet, exercise regularly, and work towards weight loss. Use sunscreen and a daily moisturizer with SPF to protect your skin.

## 2024-12-14 ENCOUNTER — Ambulatory Visit: Admitting: *Deleted

## 2024-12-14 ENCOUNTER — Encounter: Payer: Self-pay | Admitting: Family

## 2024-12-14 ENCOUNTER — Ambulatory Visit

## 2024-12-14 DIAGNOSIS — Z111 Encounter for screening for respiratory tuberculosis: Secondary | ICD-10-CM

## 2024-12-14 NOTE — Progress Notes (Cosign Needed Addendum)
 PPD Placement note Elaine Wood, 24 y.o. female is here today for placement of PPD test Reason for PPD test: school Pt taken PPD test before: yes Verified in allergy area and with patient that they are not allergic to the products PPD is made of (Phenol or Tween). Yes Is patient taking any oral or IV steroid medication now or have they taken it in the last month? no Has the patient ever received the BCG vaccine?: no Has the patient been in recent contact with anyone known or suspected of having active TB disease?: no Patient's Country of origin?: [place]  O: Alert and oriented in NAD. P:  PPD placed on 12/14/2024 left lower forearm.  Patient advised to return for reading within 48-72 hours.

## 2024-12-16 ENCOUNTER — Ambulatory Visit

## 2024-12-16 DIAGNOSIS — Z111 Encounter for screening for respiratory tuberculosis: Secondary | ICD-10-CM

## 2024-12-16 LAB — TB SKIN TEST
Induration: 0 mm
TB Skin Test: NEGATIVE

## 2024-12-16 NOTE — Progress Notes (Signed)
"  PPD Reading Note  PPD read and results entered in [employer].  Result: 0 mm induration.  Interpretation: Negative  If test not read within 48-72 hours of initial placement, patient advised to repeat in other arm 1-3 weeks after this test.  Allergic reaction: no   "

## 2025-01-09 ENCOUNTER — Ambulatory Visit: Admitting: "Endocrinology

## 2025-06-23 ENCOUNTER — Encounter: Admitting: Family
